# Patient Record
Sex: Male | Born: 1970
Health system: Southern US, Community
[De-identification: ages and names within clinical notes are randomized; demographics above are authoritative.]

## PROBLEM LIST (undated history)

## (undated) DIAGNOSIS — F419 Anxiety disorder, unspecified: Secondary | ICD-10-CM

## (undated) DIAGNOSIS — T7840XA Allergy, unspecified, initial encounter: Secondary | ICD-10-CM

## (undated) HISTORY — DX: Anxiety disorder, unspecified: F41.9

## (undated) HISTORY — DX: Allergy, unspecified, initial encounter: T78.40XA

---

## 1999-04-27 ENCOUNTER — Emergency Department (HOSPITAL_COMMUNITY): Admission: EM | Admit: 1999-04-27 | Discharge: 1999-04-27 | Payer: Self-pay | Admitting: Emergency Medicine

## 2000-08-07 ENCOUNTER — Emergency Department (HOSPITAL_COMMUNITY): Admission: EM | Admit: 2000-08-07 | Discharge: 2000-08-07 | Payer: Self-pay | Admitting: Emergency Medicine

## 2000-08-17 ENCOUNTER — Emergency Department (HOSPITAL_COMMUNITY): Admission: EM | Admit: 2000-08-17 | Discharge: 2000-08-17 | Payer: Self-pay | Admitting: Emergency Medicine

## 2011-05-31 ENCOUNTER — Ambulatory Visit (INDEPENDENT_AMBULATORY_CARE_PROVIDER_SITE_OTHER): Payer: BC Managed Care – PPO

## 2011-05-31 DIAGNOSIS — J069 Acute upper respiratory infection, unspecified: Secondary | ICD-10-CM

## 2011-10-07 ENCOUNTER — Ambulatory Visit: Payer: BC Managed Care – PPO | Admitting: Family Medicine

## 2011-10-07 DIAGNOSIS — J309 Allergic rhinitis, unspecified: Secondary | ICD-10-CM

## 2011-10-07 MED ORDER — CIPROFLOXACIN HCL 0.3 % OP SOLN: OPHTHALMIC | Status: DC

## 2011-10-07 MED ORDER — FLUTICASONE PROPIONATE 50 MCG/ACT NA SUSP: 2.0000 | Freq: Every day | NASAL | Status: DC

## 2011-10-07 NOTE — Progress Notes (Signed)
  Subjective:    Patient ID: Kevin Wang, male    DOB: 1970/08/20, 41 y.o.   MRN: [id number]  HPI 41 yo male with URI symptoms. Woke up yesterday with red eyes.  THrough the day developed sinus pain/pressure, runny nose, sneezing.  No cough.  Eyes hurt in the light, especially the left.  No fever.   Did mow very tall grass the day before symptoms started.   Review of Systems Negative except as per HPI     Objective:   Physical Exam  Constitutional: He appears well-developed. No distress.  HENT:  Right Ear: Tympanic membrane, external ear and ear canal normal. Tympanic membrane is not injected, not scarred, not perforated, not erythematous, not retracted and not bulging.  Left Ear: Tympanic membrane, external ear and ear canal normal. Tympanic membrane is not injected, not scarred, not perforated, not erythematous, not retracted and not bulging.  Nose: Mucosal edema present. No rhinorrhea. Right sinus exhibits no maxillary sinus tenderness and no frontal sinus tenderness. Left sinus exhibits no maxillary sinus tenderness and no frontal sinus tenderness.  Mouth/Throat: Uvula is midline, oropharynx is clear and moist and mucous membranes are normal. No oropharyngeal exudate or tonsillar abscesses.  Eyes: Right conjunctiva is injected. Left conjunctiva is injected.  Cardiovascular: Normal rate, regular rhythm, normal heart sounds and intact distal pulses.   No murmur heard. Pulmonary/Chest: Effort normal and breath sounds normal. No respiratory distress. He has no wheezes. He has no rales.  Lymphadenopathy:       Head (right side): No submandibular and no preauricular adenopathy present.       Head (left side): No submandibular and no preauricular adenopathy present.       Right cervical: No superficial cervical and no posterior cervical adenopathy present.      Left cervical: No superficial cervical and no posterior cervical adenopathy present.       Right: No supraclavicular adenopathy  present.       Left: No supraclavicular adenopathy present.  Skin: Skin is warm and dry.          Assessment & Plan:  Allergic rhinitis with conjunctivitis - ciloxan (contact lens wearer) and flonase

## 2012-09-03 ENCOUNTER — Ambulatory Visit: Payer: BC Managed Care – PPO | Admitting: Physician Assistant

## 2012-09-03 VITALS — HR 95 | Temp 98.8°F | Resp 16 | Ht 68.5 in | Wt 226.0 lb

## 2012-09-03 DIAGNOSIS — J309 Allergic rhinitis, unspecified: Secondary | ICD-10-CM

## 2012-09-03 DIAGNOSIS — J069 Acute upper respiratory infection, unspecified: Secondary | ICD-10-CM

## 2012-09-03 MED ORDER — BENZONATATE 100 MG PO CAPS: 100.0000 mg | ORAL_CAPSULE | Freq: Three times a day (TID) | ORAL | Status: DC | PRN

## 2012-09-03 MED ORDER — IPRATROPIUM BROMIDE 0.03 % NA SOLN: 2.0000 | Freq: Two times a day (BID) | NASAL | Status: DC

## 2012-09-03 MED ORDER — GUAIFENESIN ER 1200 MG PO TB12: 1.0000 | ORAL_TABLET | Freq: Two times a day (BID) | ORAL | Status: DC | PRN

## 2012-09-03 NOTE — Patient Instructions (Addendum)
Begin using Atrovent nasal spray twice daily - this will help dry up the runny nose and post-nasal drainage, as well as relieve pressure in the ears.  Continue Mucinex twice daily.  Begin an allergy medicine daily (Claritin/Allegra/Zyrtec - whichever is cheapest) - this will help control symptoms and will help with any allergic component.  Tessalon Perles three times per day if needed for cough.  Plenty of fluids (Water is best!)  If any of your symptoms are worsening or not improving please let us know.   Upper Respiratory Infection, Adult An upper respiratory infection (URI) is also sometimes known as the common cold. The upper respiratory tract includes the nose, sinuses, throat, trachea, and bronchi. Bronchi are the airways leading to the lungs. Most people improve within 1 week, but symptoms can last up to 2 weeks. A residual cough may last even longer.  CAUSES Many different viruses can infect the tissues lining the upper respiratory tract. The tissues become irritated and inflamed and often become very moist. Mucus production is also common. A cold is contagious. You can easily spread the virus to others by oral contact. This includes kissing, sharing a glass, coughing, or sneezing. Touching your mouth or nose and then touching a surface, which is then touched by another person, can also spread the virus. SYMPTOMS  Symptoms typically develop 1 to 3 days after you come in contact with a cold virus. Symptoms vary from person to person. They may include:  Runny nose.  Sneezing.  Nasal congestion.  Sinus irritation.  Sore throat.  Loss of voice (laryngitis).  Cough.  Fatigue.  Muscle aches.  Loss of appetite.  Headache.  Low-grade fever. DIAGNOSIS  You might diagnose your own cold based on familiar symptoms, since most people get a cold 2 to 3 times a year. Your caregiver can confirm this based on your exam. Most importantly, your caregiver can check that your symptoms are not  due to another disease such as strep throat, sinusitis, pneumonia, asthma, or epiglottitis. Blood tests, throat tests, and X-rays are not necessary to diagnose a common cold, but they may sometimes be helpful in excluding other more serious diseases. Your caregiver will decide if any further tests are required. RISKS AND COMPLICATIONS  You may be at risk for a more severe case of the common cold if you smoke cigarettes, have chronic heart disease (such as heart failure) or lung disease (such as asthma), or if you have a weakened immune system. The very young and very old are also at risk for more serious infections. Bacterial sinusitis, middle ear infections, and bacterial pneumonia can complicate the common cold. The common cold can worsen asthma and chronic obstructive pulmonary disease (COPD). Sometimes, these complications can require emergency medical care and may be life-threatening. PREVENTION  The best way to protect against getting a cold is to practice good hygiene. Avoid oral or hand contact with people with cold symptoms. Wash your hands often if contact occurs. There is no clear evidence that vitamin C, vitamin E, echinacea, or exercise reduces the chance of developing a cold. However, it is always recommended to get plenty of rest and practice good nutrition. TREATMENT  Treatment is directed at relieving symptoms. There is no cure. Antibiotics are not effective, because the infection is caused by a virus, not by bacteria. Treatment may include:  Increased fluid intake. Sports drinks offer valuable electrolytes, sugars, and fluids.  Breathing heated mist or steam (vaporizer or shower).  Eating chicken soup or other  clear broths, and maintaining good nutrition.  Getting plenty of rest.  Using gargles or lozenges for comfort.  Controlling fevers with ibuprofen or acetaminophen as directed by your caregiver.  Increasing usage of your inhaler if you have asthma. Zinc gel and zinc  lozenges, taken in the first 24 hours of the common cold, can shorten the duration and lessen the severity of symptoms. Pain medicines may help with fever, muscle aches, and throat pain. A variety of non-prescription medicines are available to treat congestion and runny nose. Your caregiver can make recommendations and may suggest nasal or lung inhalers for other symptoms.  HOME CARE INSTRUCTIONS   Only take over-the-counter or prescription medicines for pain, discomfort, or fever as directed by your caregiver.  Use a warm mist humidifier or inhale steam from a shower to increase air moisture. This may keep secretions moist and make it easier to breathe.  Drink enough water and fluids to keep your urine clear or pale yellow.  Rest as needed.  Return to work when your temperature has returned to normal or as your caregiver advises. You may need to stay home longer to avoid infecting others. You can also use a face mask and careful hand washing to prevent spread of the virus. SEEK MEDICAL CARE IF:   After the first few days, you feel you are getting worse rather than better.  You need your caregiver's advice about medicines to control symptoms.  You develop chills, worsening shortness of breath, or brown or red sputum. These may be signs of pneumonia.  You develop yellow or brown nasal discharge or pain in the face, especially when you bend forward. These may be signs of sinusitis.  You develop a fever, swollen neck glands, pain with swallowing, or white areas in the back of your throat. These may be signs of strep throat. SEEK IMMEDIATE MEDICAL CARE IF:   You have a fever.  You develop severe or persistent headache, ear pain, sinus pain, or chest pain.  You develop wheezing, a prolonged cough, cough up blood, or have a change in your usual mucus (if you have chronic lung disease).  You develop sore muscles or a stiff neck. Document Released: 11/05/2000 Document Revised: 08/04/2011  Document Reviewed: 09/13/2010 [facility] Patient Information 2013 [place], [place].   Allergic Rhinitis Allergic rhinitis is when the mucous membranes in the nose respond to allergens. Allergens are particles in the air that cause your body to have an allergic reaction. This causes you to release allergic antibodies. Through a chain of events, these eventually cause you to release histamine into the blood stream (hence the use of antihistamines). Although meant to be protective to the body, it is this release that causes your discomfort, such as frequent sneezing, congestion and an itchy runny nose.  CAUSES  The pollen allergens may come from grasses, trees, and weeds. This is seasonal allergic rhinitis, or "hay fever." Other allergens cause year-round allergic rhinitis (perennial allergic rhinitis) such as house dust mite allergen, pet dander and mold spores.  SYMPTOMS   Nasal stuffiness (congestion).  Runny, itchy nose with sneezing and tearing of the eyes.  There is often an itching of the mouth, eyes and ears. It cannot be cured, but it can be controlled with medications. DIAGNOSIS  If you are unable to determine the offending allergen, skin or blood testing may find it. TREATMENT   Avoid the allergen.  Medications and allergy shots (immunotherapy) can help.  Hay fever may often be treated with antihistamines  in pill or nasal spray forms. Antihistamines block the effects of histamine. There are over-the-counter medicines that may help with nasal congestion and swelling around the eyes. Check with your caregiver before taking or giving this medicine. If the treatment above does not work, there are many new medications your caregiver can prescribe. Stronger medications may be used if initial measures are ineffective. Desensitizing injections can be used if medications and avoidance fails. Desensitization is when a patient is given ongoing shots until the body becomes less sensitive to the  allergen. Make sure you follow up with your caregiver if problems continue. SEEK MEDICAL CARE IF:   You develop fever (more than 100.5 F (38.1 C).  You develop a cough that does not stop easily (persistent).  You have shortness of breath.  You start wheezing.  Symptoms interfere with normal daily activities. Document Released: 02/04/2001 Document Revised: 08/04/2011 Document Reviewed: 08/16/2008 [facility] Patient Information 2013 [place], [place].

## 2012-09-03 NOTE — Progress Notes (Signed)
  Subjective:    Patient ID: Kevin Wang, male    DOB: Aug 07, 1970, 42 y.o.   MRN: [id number]  HPI   Kevin Wang is a very pleasant 42 yr old male here with concern for illness.  States that he has been feeling "above average tired" for the last few days.  Two days ago he began sneezing and feeling congested.  Post-nasal drainage, cough, sore throat, itchy ears.  Muscles sore.  Occasionally kind of dizzy.  Denies fevers or chills.  Wife has been sick with similar symptoms, treated for a sinus infection.  Daughter also with similar symptoms.  Pt has been using Mucinex.  Pt seen about a year ago here for allergic rhinitis, not currently treating allergies.  States "this doesn't feel like allergies."    Review of Systems  Constitutional: Positive for fatigue. Negative for fever and chills.  HENT: Positive for ear pain (itchy, pressure), congestion, sore throat, rhinorrhea, sneezing and postnasal drip.   Respiratory: Positive for cough. Negative for shortness of breath and wheezing.   Cardiovascular: Negative.   Gastrointestinal: Negative.   Musculoskeletal: Positive for myalgias.  Skin: Negative.   Neurological: Positive for dizziness. Negative for light-headedness and headaches.       Objective:   Physical Exam  Vitals reviewed. Constitutional: He is oriented to person, place, and time. He appears well-developed and well-nourished. No distress.  HENT:  Head: Normocephalic and atraumatic.  Right Ear: Tympanic membrane and ear canal normal.  Left Ear: Tympanic membrane and ear canal normal.  Nose: Mucosal edema (pale turbinates) and rhinorrhea present. Right sinus exhibits no maxillary sinus tenderness and no frontal sinus tenderness. Left sinus exhibits no maxillary sinus tenderness and no frontal sinus tenderness.  Mouth/Throat: Uvula is midline, oropharynx is clear and moist and mucous membranes are normal.  Neck: Neck supple.  Cardiovascular: Normal rate, regular rhythm and normal heart  sounds.   Pulmonary/Chest: Effort normal and breath sounds normal. He has no wheezes. He has no rales.  Lymphadenopathy:    He has no cervical adenopathy.  Neurological: He is alert and oriented to person, place, and time.  Skin: Skin is warm and dry.  Psychiatric: He has a normal mood and affect. His behavior is normal.     Filed Vitals:   09/03/12 1426  Pulse: 95  Temp: 98.8 F (37.1 C)  Resp: 16        Assessment & Plan:  URI (upper respiratory infection) - Plan: ipratropium (ATROVENT) 0.03 % nasal spray, Guaifenesin (MUCINEX MAXIMUM STRENGTH) 1200 MG TB12, benzonatate (TESSALON) 100 MG capsule  -- Very pleasant 42 yr old male with two days of URI symptoms.  Afebrile, lungs CTA.  Discussed with pt that this is likely viral, and he does not warrant abx at this time.  Will treat symptoms with Atrovent nasal, Tessalon, Mucinex.  Push fluids.    Allergic rhinitis  -- Also suspect an allergic component to his symptoms.  Encouraged pt to begin OTC antihistamine daily.  This will help with symptoms regardless, but may offer extra benefit if there is underlying allergic rhinitis.    Pt will call if any symptoms worsen or do not improve.  Discussed with pt that in that case he may need to RTC.  He understands and is in agreement.

## 2012-11-27 ENCOUNTER — Ambulatory Visit (INDEPENDENT_AMBULATORY_CARE_PROVIDER_SITE_OTHER): Payer: BC Managed Care – PPO | Admitting: Family Medicine

## 2012-11-27 VITALS — BP 122/80 | HR 67 | Temp 97.9°F | Resp 18 | Ht 69.5 in | Wt 231.0 lb

## 2012-11-27 DIAGNOSIS — R195 Other fecal abnormalities: Secondary | ICD-10-CM

## 2012-11-27 DIAGNOSIS — M549 Dorsalgia, unspecified: Secondary | ICD-10-CM

## 2012-11-27 DIAGNOSIS — R197 Diarrhea, unspecified: Secondary | ICD-10-CM

## 2012-11-27 DIAGNOSIS — E039 Hypothyroidism, unspecified: Secondary | ICD-10-CM

## 2012-11-27 LAB — COMPREHENSIVE METABOLIC PANEL WITH GFR
AST: 19 U/L (ref 0–37)
Albumin: 4.4 g/dL (ref 3.5–5.2)
BUN: 16 mg/dL (ref 6–23)
Calcium: 9.5 mg/dL (ref 8.4–10.5)
Chloride: 103 meq/L (ref 96–112)
Glucose, Bld: 83 mg/dL (ref 70–99)
Potassium: 4.2 meq/L (ref 3.5–5.3)
Sodium: 139 meq/L (ref 135–145)
Total Protein: 6.9 g/dL (ref 6.0–8.3)

## 2012-11-27 LAB — POCT CBC
Granulocyte percent: 53.6 % (ref 37–80)
HCT, POC: 43.8 % (ref 43.5–53.7)
Hemoglobin: 14.2 g/dL (ref 14.1–18.1)
Lymph, poc: 2.6 (ref 0.6–3.4)
MCH, POC: 31 pg (ref 27–31.2)
MCHC: 32.4 g/dL (ref 31.8–35.4)
MCV: 95.6 fL (ref 80–97)
MID (cbc): 0.6 (ref 0–0.9)
MPV: 8.1 fL (ref 0–99.8)
POC Granulocyte: 3.6 (ref 2–6.9)
POC LYMPH PERCENT: 38.2 %L (ref 10–50)
POC MID %: 8.2 %M (ref 0–12)
Platelet Count, POC: 266 10*3/uL (ref 142–424)
RBC: 4.58 M/uL — AB (ref 4.69–6.13)
RDW, POC: 12.9 %
WBC: 6.8 10*3/uL (ref 4.6–10.2)

## 2012-11-27 LAB — COMPREHENSIVE METABOLIC PANEL
ALT: 15 U/L (ref 0–53)
Alkaline Phosphatase: 66 U/L (ref 39–117)
CO2: 27 mEq/L (ref 19–32)
Creat: 1.26 mg/dL (ref 0.50–1.35)
Total Bilirubin: 0.4 mg/dL (ref 0.3–1.2)

## 2012-11-27 LAB — LIPASE: Lipase: 25 U/L (ref 0–75)

## 2012-11-27 LAB — TSH: TSH: 2.22 u[IU]/mL (ref 0.350–4.500)

## 2012-11-27 NOTE — Progress Notes (Signed)
[facility]:  Office Visit  Chief Complaint:  Chief Complaint  Patient presents with  . Stool Color Change    White stool    HPI: Kevin Wang is a 42 y.o. male who complains of  2-3 week history of noticing stool had changes to it, sometimes he would see white stool intermixed with normal coler stool. No fevers, chills, unintentional weightloss. No blood in stool or urine.  Eating more Chobani green yogurt. Also drinking more smoothies with yogurt in them. Only takes multivitamins.He has taken [place] for years now,He takes protein shakes everyday.Was on abx for tooth extraction 1 week ago, then he started having nonbloody diarrhea. No he noticed this. The diarrhea started after he was on abx. No new travels, no well water, no family or personal hsitory of GI disorder,  Gallbladder dz, pancreatic dz, liver dz . Denies Crohn, colitis, IBD, IBS  Denies depression, is a little sad that Daughter is going to [employer], but he is very close to her. Has had normal memory issues, thinks his mempory could be better but not unusual. He is sleeping well. Was on meds for hypothyroid but stopped. He states he has been feeling tired more than normal.    Past Medical History  Diagnosis Date  . Allergy    History reviewed. No pertinent past surgical history. History   Social History  . Marital Status: Married    Spouse Name: N/A    Number of Children: N/A  . Years of Education: N/A   Occupational History  . auditor [employer]   Social History Main Topics  . Smoking status: Never Smoker   . Smokeless tobacco: None  . Alcohol Use: None  . Drug Use: None  . Sexually Active: Yes     Comment: number of sex partners in the last 12 months 1   Other Topics Concern  . None   Social History Narrative   Exercise cardio and   Family History  Problem Relation Age of Onset  . Asthma Mother   . Cancer Mother     colon   No Known Allergies Prior to  Admission medications   Medication Sig Start Date End Date Taking? Authorizing Provider  benzonatate (TESSALON) 100 MG capsule Take 1-2 capsules (100-200 mg total) by mouth 3 (three) times daily as needed for cough. 09/03/12   Eleanore E Egan, PA-C  Guaifenesin (MUCINEX MAXIMUM STRENGTH) 1200 MG TB12 Take 1 tablet (1,200 mg total) by mouth every 12 (twelve) hours as needed. 09/03/12   Eleanore Delia Chimes, PA-C  ipratropium (ATROVENT) 0.03 % nasal spray Place 2 sprays into the nose 2 (two) times daily. 09/03/12   Eleanore Delia Chimes, PA-C     ROS: The patient denies fevers, chills, night sweats, unintentional weight loss, chest pain, palpitations, wheezing, dyspnea on exertion, nausea, vomiting, abdominal pain, dysuria, hematuria, melena, numbness, weakness, or tingling.   All other systems have been reviewed and were otherwise negative with the exception of those mentioned in the HPI and as above.    PHYSICAL EXAM: Filed Vitals:   11/27/12 1230  BP: 122/80  Pulse: 67  Temp: 97.9 F (36.6 C)  Resp: 18   Filed Vitals:   11/27/12 1230  Height: 5' 9.5" (1.765 m)  Weight: 231 lb (104.781 kg)   Body mass index is 33.64 kg/(m^2).  General: Alert, no acute distress HEENT:  Normocephalic, atraumatic, oropharynx patent. EOMI, PERRLA, no thyroidmegaly Cardiovascular:  Regular rate and rhythm, no rubs  murmurs or gallops.  No Carotid bruits, radial pulse intact. No pedal edema.  Respiratory: Clear to auscultation bilaterally.  No wheezes, rales, or rhonchi.  No cyanosis, no use of accessory musculature GI: No organomegaly, abdomen is soft and non-tender, positive bowel sounds.  No masses. Skin: No rashes. Neurologic: Facial musculature symmetric. Psychiatric: Patient is appropriate throughout our interaction. Lymphatic: No cervical lymphadenopathy Musculoskeletal: Gait intact.   LABS: Results for orders placed in visit on 11/27/12  POCT CBC      Result Value Range   WBC 6.8  4.6 - 10.2 K/uL    Lymph, poc 2.6  0.6 - 3.4   POC LYMPH PERCENT 38.2  10 - 50 %L   MID (cbc) 0.6  0 - 0.9   POC MID % 8.2  0 - 12 %M   POC Granulocyte 3.6  2 - 6.9   Granulocyte percent 53.6  37 - 80 %G   RBC 4.58 (*) 4.69 - 6.13 M/uL   Hemoglobin 14.2  14.1 - 18.1 g/dL   HCT, POC 96.0  45.4 - 53.7 %   MCV 95.6  80 - 97 fL   MCH, POC 31.0  27 - 31.2 pg   MCHC 32.4  31.8 - 35.4 g/dL   RDW, POC 09.8     Platelet Count, POC 266  142 - 424 K/uL   MPV 8.1  0 - 99.8 fL     EKG/XRAY:   Primary read interpreted by Dr. Conley Rolls at [facility].   ASSESSMENT/PLAN: Encounter Diagnoses  Name Primary?  . Abnormal stool color Yes  . Back pain   . Diarrhea   . Unspecified hypothyroidism    Will await labs ? Etiology of intermitent pale colored stools: Liver/Pancreatic/GB  issues vs yeast in stool He is not feeling himself in terms of motivation, not motivated so will get TSH level Apparently was supposed to be on thyroid medicine CMP, lipase TSH, stool cx and OP pending He will bring back stool sample I will f/u after labs and stool samples given and results are in If no improvement or no dx then will refer to GI    Nariah Morgano PHUONG, DO 11/27/2012 2:32 PM

## 2012-11-30 LAB — OVA AND PARASITE EXAMINATION: OP: NONE SEEN

## 2012-12-02 LAB — STOOL CULTURE

## 2013-11-17 ENCOUNTER — Telehealth: Payer: Self-pay

## 2013-11-17 NOTE — Telephone Encounter (Signed)
PATIENT WOULD LIKE TO KNOW IF AN OFFICE VISIT IS NECESSARY TO GET A PERSCRIPTION FOR ANXIETY MEDICATION (PATIENT CAN'T REMEMBER THE NAME OF THE MEDICATION). PLEASE CALL PATIENT! [phone]

## 2013-11-18 NOTE — Telephone Encounter (Signed)
Advised pt he does need an OV for this prescription. Has not been seen since 6/14

## 2013-11-30 ENCOUNTER — Ambulatory Visit (INDEPENDENT_AMBULATORY_CARE_PROVIDER_SITE_OTHER): Payer: BC Managed Care – PPO | Admitting: Family Medicine

## 2013-11-30 VITALS — BP 128/88 | HR 93 | Temp 98.4°F | Resp 18 | Ht 68.5 in | Wt 227.0 lb

## 2013-11-30 DIAGNOSIS — F411 Generalized anxiety disorder: Secondary | ICD-10-CM

## 2013-11-30 MED ORDER — ALPRAZOLAM 0.25 MG PO TABS: 0.2500 mg | ORAL_TABLET | Freq: Two times a day (BID) | ORAL | Status: DC | PRN

## 2013-11-30 NOTE — Progress Notes (Signed)
   Subjective:    Patient ID: Kevin Wang, male    DOB: 1970-12-04, 43 y.o.   MRN: [id number]  HPI This is a very pleasant 43 yo male who presents today with complaint of performance anxiety related to his work. Last week he was required to give a presentation and found that he was very panicked. He became diaphoretic and flustered. He has had problems with presentations in the past and was prescribed something from here several years ago- thinks it was xanax at a low dose. He reports using this sparingly over the last several years until he ran out of the medicine. He does not receive medical care anywhere else and has been healthy, requiring no regular medications.   He denies any anxiety or stress in other aspects of his life. He exercises 5-6 times a week and sleeps well.    Review of Systems No chest pain, no SOB  Objective:   Physical Exam  Vitals reviewed. Constitutional: He is oriented to person, place, and time. He appears well-developed and well-nourished. No distress.  HENT:  Head: Normocephalic and atraumatic.  Eyes: Conjunctivae are normal. Right eye exhibits no discharge. Left eye exhibits no discharge.  Neck: Normal range of motion. Neck supple.  Cardiovascular: Normal rate, regular rhythm and normal heart sounds.   Pulmonary/Chest: Effort normal and breath sounds normal.  Neurological: He is alert and oriented to person, place, and time.  Skin: Skin is warm and dry. He is not diaphoretic.  Psychiatric: He has a normal mood and affect. His behavior is normal. Judgment and thought content normal.      Assessment & Plan:  1. Anxiety state, unspecified - ALPRAZolam (XANAX) 0.25 MG tablet; Take 1 tablet (0.25 mg total) by mouth 2 (two) times daily as needed for anxiety.  Dispense: 20 tablet; Refill: 0 -discussed habit forming nature of xanax and that it is suitable for occasional, episodic use. If he finds that he is having more than a couple episodes of anxiety a month, he  will consider other treatment -encouraged continued regular exercise for stress relief. -recommended that he make an appointment for CPE at the appointment center.  Emi Belfasteborah B. Gessner, FNP-BC  [facility], [facility]  11/30/2013 9:19 AM

## 2014-05-27 ENCOUNTER — Other Ambulatory Visit: Payer: Self-pay | Admitting: Family Medicine

## 2014-05-29 ENCOUNTER — Telehealth: Payer: Self-pay | Admitting: Family Medicine

## 2014-05-29 NOTE — Telephone Encounter (Signed)
Called patient and left message. Will fax Xanax Rx. Encouraged him to make appointment for CPE.

## 2014-10-12 ENCOUNTER — Ambulatory Visit (INDEPENDENT_AMBULATORY_CARE_PROVIDER_SITE_OTHER): Payer: BLUE CROSS/BLUE SHIELD

## 2014-10-12 ENCOUNTER — Ambulatory Visit (INDEPENDENT_AMBULATORY_CARE_PROVIDER_SITE_OTHER): Payer: BLUE CROSS/BLUE SHIELD | Admitting: Family Medicine

## 2014-10-12 VITALS — BP 124/72 | HR 67 | Temp 98.0°F | Resp 18 | Ht 70.0 in | Wt 240.0 lb

## 2014-10-12 DIAGNOSIS — M25571 Pain in right ankle and joints of right foot: Secondary | ICD-10-CM | POA: Diagnosis not present

## 2014-10-12 DIAGNOSIS — F4329 Adjustment disorder with other symptoms: Secondary | ICD-10-CM | POA: Diagnosis not present

## 2014-10-12 MED ORDER — ALPRAZOLAM 0.25 MG PO TABS: 0.2500 mg | ORAL_TABLET | Freq: Two times a day (BID) | ORAL | Status: DC | PRN

## 2014-10-12 NOTE — Patient Instructions (Signed)
Use the alprazolam only when needed  Wear the ankle brace for a couple weeks to try and support the ankle. Return if problems persist.  Ibuprofen 600 mg 3 times daily if needed for pain  Ice the ankle several times daily.

## 2014-10-12 NOTE — Progress Notes (Addendum)
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 He was taking out the trash yesterday and there is a broken board on the steps which caused him to turn his right ankle. He was able to grab the rail did not fall completely. The D and have pain initially, but it was not real bad. He did go ahead and go to the gym yesterday and a but then it started hurting enough that he iced it several times. He is continued to have pain, on weightbearing especially in the right ankle on the dorsal aspect.  Into refill on the alprazolam. He'll use it when he knows he is in an anxiety provoking situations such as giving a presentation.  Also describes having some episodic sharp pains here and there in his body, sometimes a couple places simultaneously, which are very fleeting. No particular pattern to this. Discussed that a new nothing differently do about them unless he gets more persistent or pattern to it.  Objective: Motion of toes and ankle are normal. He has a little bit of swelling on the top of the foot. No obvious ecchymosis. Tenderness distal and medial to the right medial malleolus and distal to the lateral malleolus. That seems to be where it hurts most. Both inversion and eversion of the foot hurt considerably though lateral flexion or dorsiflexion do not seem to hurt that much.  Assessment: Probable sprained ankle with ankle pain Presentation anxiety  Plan: X-ray ankle and foot  [facility] reading (PRIMARY) by  Dr. Alwyn RenHopper No acute injury. Old spurs of the calcaneus  Treat with ankle splint  Reassurance. See instructions

## 2015-05-10 ENCOUNTER — Other Ambulatory Visit: Payer: Self-pay

## 2015-05-10 DIAGNOSIS — F4329 Adjustment disorder with other symptoms: Secondary | ICD-10-CM

## 2015-05-10 NOTE — Telephone Encounter (Signed)
Pharm requests RF of alprazolam. Pended. 

## 2015-05-18 ENCOUNTER — Other Ambulatory Visit: Payer: Self-pay

## 2015-05-18 DIAGNOSIS — F4329 Adjustment disorder with other symptoms: Secondary | ICD-10-CM

## 2015-05-18 NOTE — Telephone Encounter (Signed)
Pharm reqs RF of alprazolam. Pended. 

## 2015-05-21 ENCOUNTER — Other Ambulatory Visit: Payer: Self-pay

## 2015-05-21 DIAGNOSIS — F4329 Adjustment disorder with other symptoms: Secondary | ICD-10-CM

## 2015-05-21 NOTE — Telephone Encounter (Signed)
Pharm reqs RFs of alprazolam. Pended.

## 2015-11-19 ENCOUNTER — Other Ambulatory Visit: Payer: Self-pay

## 2015-11-20 ENCOUNTER — Telehealth: Payer: Self-pay

## 2015-11-22 NOTE — Telephone Encounter (Signed)
Over a year since seen.  Needs to come to see a provider if need for rx is to be considered.

## 2015-11-25 NOTE — Telephone Encounter (Signed)
LMOM at pharmacy to deny Rf, pt needs OV.

## 2015-12-06 ENCOUNTER — Ambulatory Visit (INDEPENDENT_AMBULATORY_CARE_PROVIDER_SITE_OTHER): Payer: BLUE CROSS/BLUE SHIELD | Admitting: Family Medicine

## 2015-12-06 ENCOUNTER — Encounter: Payer: Self-pay | Admitting: Family Medicine

## 2015-12-06 VITALS — BP 120/72 | HR 84 | Temp 98.6°F | Resp 16 | Ht 69.0 in | Wt 195.8 lb

## 2015-12-06 DIAGNOSIS — F418 Other specified anxiety disorders: Secondary | ICD-10-CM | POA: Diagnosis not present

## 2015-12-06 MED ORDER — ALPRAZOLAM 0.25 MG PO TABS: 0.2500 mg | ORAL_TABLET | Freq: Two times a day (BID) | ORAL | Status: DC | PRN

## 2015-12-06 NOTE — Patient Instructions (Addendum)
     IF you received an x-ray today, you will receive an invoice from [facility] Please contact [facility] at [phone] with questions or concerns regarding your invoice.   IF you received labwork today, you will receive an invoice from [facility]. Please contact [facility] at [phone] with questions or concerns regarding your invoice.   Our billing staff will not be able to assist you with questions regarding bills from these companies.  You will be contacted with the lab results as soon as they are available. The fastest way to get your results is to activate your My Chart account. Instructions are located on the last page of this paperwork. If you have not heard from us regarding the results in 2 weeks, please contact this office.    No change in medications for now. Continue exercise and Xanax as needed for situational anxiety. Follow-up in the next 6 months if possible for physical. Let me know if you have questions in the meantime.

## 2015-12-06 NOTE — Progress Notes (Signed)
By signing my name below, I, Mesha Guinyard, attest that this documentation has been prepared under the direction and in the presence of Meredith StaggersJeffrey Rorie Delmore, MD.  Electronically Signed: Arvilla MarketMesha Guinyard, Medical Scribe. 12/06/2015. 8:20 AM.  Subjective:    Patient ID: Kevin Wang, male    DOB: 10-May-1971, 45 y.o.   MRN: [id number]  HPI Chief Complaint  Patient presents with  . Medication Refill    Alprazolam 0.25 mg    HPI Comments: Kevin Austinshley Cudd is a 45 y.o. male who presents to the [facility] for medication refill previous pt of Dr. Alwyn RenHopper. Hx of anxiety, adjustment disorder, prior notes to use Xanax for appropriate situations such as giving presentation. Last prescription May 19th 2016 for number 20 with 1 refill.  Pt uses Xanax when he knows that he may have an attack- when he's going to have a meeting, or when he's "on the spot". Pt has taken two pills at a time to get rid of his anxiety before. Pt states he takes it once a week on average.  Pt still has some anxiety when he takes it, but he doesn't have an attack. Pt gets sleepy when he takes Xanax at times. Pt exercises 5-6 times a week. Pt denies suicidal ideation, depression, thoughts of self-harm, EtOH abuse, or marijuana use.  Depression screen PHQ 2/9 12/06/2015  Decreased Interest 0  Down, Depressed, Hopeless 0  PHQ - 2 Score 0      There are no active problems to display for this patient.  Past Medical History  Diagnosis Date  . Allergy   . Anxiety    No past surgical history on file. No Known Allergies Prior to Admission medications   Medication Sig Start Date End Date Taking? Authorizing Provider  ALPRAZolam (XANAX) 0.25 MG tablet Take 1 tablet (0.25 mg total) by mouth 2 (two) times daily as needed. for anxiety 10/12/14   Peyton Najjaravid H Hopper, MD   Social History   Social History  . Marital Status: Married    Spouse Name: N/A  . Number of Children: N/A  . Years of Education: N/A   Occupational  History  . auditor [employer]   Social History Main Topics  . Smoking status: Never Smoker   . Smokeless tobacco: Not on file  . Alcohol Use: No  . Drug Use: No  . Sexual Activity: Yes     Comment: number of sex partners in the last 12 months 1   Other Topics Concern  . Not on file   Social History Narrative   Exercise cardio and   Review of Systems  Psychiatric/Behavioral: Negative for suicidal ideas and self-injury. The patient is nervous/anxious.     Objective:  BP 120/72 mmHg  Pulse 84  Temp(Src) 98.6 F (37 C) (Oral)  Resp 16  Ht 5\' 9"  (1.753 m)  Wt 195 lb 12.8 oz (88.814 kg)  BMI 28.90 kg/m2  SpO2 97%  Physical Exam  Constitutional: He is oriented to person, place, and time. He appears well-developed and well-nourished.  HENT:  Head: Normocephalic and atraumatic.  Eyes: EOM are normal. Pupils are equal, round, and reactive to light.  Neck: No JVD present. Carotid bruit is not present.  Cardiovascular: Normal rate, regular rhythm and normal heart sounds.   No murmur heard. Pulmonary/Chest: Effort normal and breath sounds normal. He has no rales.  Musculoskeletal: He exhibits no edema.  Neurological: He is alert and oriented to person, place, and time.  Skin: Skin  is warm and dry.  Psychiatric: He has a normal mood and affect.  Vitals reviewed.  Assessment & Plan:   [name] is a 45 y.o. male Situational anxiety - Plan: ALPRAZolam (XANAX) 0.25 MG tablet Doing well with episodic dosing of Xanax for situation anxiety only. Recommend continue exercise, no change in treatment at this time. Do not think he needs to be on a daily SSRI based on infrequent symptoms. Recheck in the next 6 months for physical.  Meds ordered this encounter  Medications  . ALPRAZolam (XANAX) 0.25 MG tablet    Sig: Take 1 tablet (0.25 mg total) by mouth 2 (two) times daily as needed. for anxiety    Dispense:  20 tablet    Refill:  1   Patient Instructions       IF you  received an x-ray today, you will receive an invoice from [facility]. Please contact [facility] at [phone] with questions or concerns regarding your invoice.   IF you received labwork today, you will receive an invoice from [facility]. Please contact [facility] at [phone] with questions or concerns regarding your invoice.   Our billing staff will not be able to assist you with questions regarding bills from these companies.  You will be contacted with the lab results as soon as they are available. The fastest way to get your results is to activate your My Chart account. Instructions are located on the last page of this paperwork. If you have not heard from [place] regarding the results in 2 weeks, please contact this office.    No change in medications for now. Continue exercise and Xanax as needed for situational anxiety. Follow-up in the next 6 months if possible for physical. Let me know if you have questions in the meantime.    I personally performed the services described in this documentation, which was scribed in my presence. The recorded information has been reviewed and considered, and addended by me as needed.   Signed,   Meredith Staggers, MD [facility].  12/06/2015 8:30 AM

## 2016-06-30 ENCOUNTER — Other Ambulatory Visit: Payer: Self-pay | Admitting: Family Medicine

## 2016-06-30 ENCOUNTER — Other Ambulatory Visit: Payer: Self-pay | Admitting: *Deleted

## 2016-06-30 DIAGNOSIS — F418 Other specified anxiety disorders: Secondary | ICD-10-CM

## 2016-06-30 MED ORDER — ALPRAZOLAM 0.25 MG PO TABS: 0.2500 mg | ORAL_TABLET | Freq: Two times a day (BID) | ORAL | 0 refills | Status: DC | PRN

## 2016-06-30 NOTE — Progress Notes (Unsigned)
Please advise patient that we called in one prescription for xanax, she will need to come in for a follow up for any additional refills

## 2016-07-01 NOTE — Telephone Encounter (Signed)
Spoke with patient he is aware he will need an office visit before his prescription runs out.

## 2017-05-08 ENCOUNTER — Ambulatory Visit (INDEPENDENT_AMBULATORY_CARE_PROVIDER_SITE_OTHER): Payer: BLUE CROSS/BLUE SHIELD | Admitting: Family Medicine

## 2017-05-08 ENCOUNTER — Encounter: Payer: Self-pay | Admitting: Family Medicine

## 2017-05-08 VITALS — BP 150/96 | HR 71 | Temp 98.3°F | Resp 17 | Ht 70.0 in | Wt 219.0 lb

## 2017-05-08 DIAGNOSIS — Z23 Encounter for immunization: Secondary | ICD-10-CM

## 2017-05-08 DIAGNOSIS — Z113 Encounter for screening for infections with a predominantly sexual mode of transmission: Secondary | ICD-10-CM

## 2017-05-08 DIAGNOSIS — Z13220 Encounter for screening for lipoid disorders: Secondary | ICD-10-CM

## 2017-05-08 DIAGNOSIS — Z131 Encounter for screening for diabetes mellitus: Secondary | ICD-10-CM | POA: Diagnosis not present

## 2017-05-08 DIAGNOSIS — Z1211 Encounter for screening for malignant neoplasm of colon: Secondary | ICD-10-CM | POA: Diagnosis not present

## 2017-05-08 DIAGNOSIS — N529 Male erectile dysfunction, unspecified: Secondary | ICD-10-CM | POA: Diagnosis not present

## 2017-05-08 DIAGNOSIS — Z0000 Encounter for general adult medical examination without abnormal findings: Secondary | ICD-10-CM | POA: Diagnosis not present

## 2017-05-08 DIAGNOSIS — R030 Elevated blood-pressure reading, without diagnosis of hypertension: Secondary | ICD-10-CM | POA: Diagnosis not present

## 2017-05-08 DIAGNOSIS — F418 Other specified anxiety disorders: Secondary | ICD-10-CM

## 2017-05-08 MED ORDER — ALPRAZOLAM 0.25 MG PO TABS: 0.2500 mg | ORAL_TABLET | Freq: Two times a day (BID) | ORAL | 0 refills | Status: DC | PRN

## 2017-05-08 MED ORDER — SILDENAFIL CITRATE 100 MG PO TABS: 50.0000 mg | ORAL_TABLET | Freq: Every day | ORAL | 3 refills | Status: DC | PRN

## 2017-05-08 NOTE — Progress Notes (Signed)
Subjective:  By signing my name below, I, Kevin Wang, attest that this documentation has been prepared under the direction and in the presence of Kevin Flood, MD Electronically Signed: Charline Bills, ED Scribe 05/08/2017 at 12:33 PM.   Patient ID: Kevin Wang, male    DOB: 1971-05-09, 46 y.o.   MRN: [id number]  Chief Complaint  Patient presents with  . Annual Exam   HPI Kevin Wang is a 46 y.o. male who presents to Primary Care at [facility] for annual exam.   H/o Situational Anxiety Last seen 11/2015 with episodic use of Xanax only. #20 Xanax with 1 refill provided at that time. Decided against SSRI due to infrequent symptoms. States him and his wife recently separated. He has done counseling but has noticed that anxiety has improved since separating. Pt has only taken Xanax once within the last 3 months. States he mainly uses it for public speaking.   ED Pt reports difficulty obtaining and maintaining erections over the past year, which has worsened. Denies waking with erection as well. Has not taken any meds for this such as Viagra. He is not currently sexually active with a new partner. Denies cp or sob with exercise, h/o MI or heart disease.  CA Screening  Colonoscopy: mother has a hx at age 43 Prostate CA Screening: no family hx. Denies difficulty urinating, hematuria.   STI Screening: pt agrees to screening at this visit  Immunizations Immunization History  Administered Date(s) Administered  . Influenza-Unspecified 02/24/2015  Flu: will have today Tetanus: suspects it was more than 10 yrs  Depression Screening Depression screen [facility] 2/9 05/08/2017 12/06/2015  Decreased Interest 0 0  Down, Depressed, Hopeless 0 0  PHQ - 2 Score 0 0     Visual Acuity Screening   Right eye Left eye Both eyes  Without correction:  20/100   With correction: 20/30  20/30   Vision: pt wears glasses; visit within the yr Dentist: sees regularly  Exercise: 5-6 days/week  Pt does  not check his BP outside of the office. Reports having ~3 alcoholic beverages/week.   There are no active problems to display for this patient.  Past Medical History:  Diagnosis Date  . Allergy   . Anxiety    No past surgical history on file. No Known Allergies Prior to Admission medications   Medication Sig Start Date End Date Taking? Authorizing Provider  ALPRAZolam (XANAX) 0.25 MG tablet Take 1 tablet (0.25 mg total) by mouth 2 (two) times daily as needed. for anxiety 06/30/16   Kevin Flood, MD   Social History   Socioeconomic History  . Marital status: Married    Spouse name: Not on file  . Number of children: Not on file  . Years of education: Not on file  . Highest education level: Not on file  Social Needs  . Financial resource strain: Not on file  . Food insecurity - worry: Not on file  . Food insecurity - inability: Not on file  . Transportation needs - medical: Not on file  . Transportation needs - non-medical: Not on file  Occupational History  . Occupation: Electronics engineer: [employer]  Tobacco Use  . Smoking status: Never Smoker  . Smokeless tobacco: Never Used  Substance and Sexual Activity  . Alcohol use: No  . Drug use: No  . Sexual activity: Yes    Comment: number of sex partners in the last 12 months 1  Other Topics Concern  .  Not on file  Social History Narrative   Exercise cardio and   Review of Systems  Respiratory: Negative for shortness of breath.   Cardiovascular: Negative for chest pain.  Genitourinary: Negative for difficulty urinating and hematuria.  Psychiatric/Behavioral: The patient is nervous/anxious (situational).       Objective:   Physical Exam  Constitutional: He is oriented to person, place, and time. He appears well-developed and well-nourished.  HENT:  Head: Normocephalic and atraumatic.  Right Ear: External ear normal.  Left Ear: External ear normal.  Mouth/Throat: Oropharynx is clear and moist.  Eyes:  Conjunctivae and EOM are normal. Pupils are equal, round, and reactive to light.  Neck: Normal range of motion. Neck supple. No thyromegaly present.  Cardiovascular: Normal rate, regular rhythm, normal heart sounds and intact distal pulses.  Pulmonary/Chest: Effort normal and breath sounds normal. No respiratory distress. He has no wheezes.  Abdominal: Soft. He exhibits no distension. There is no tenderness.  Musculoskeletal: Normal range of motion. He exhibits no edema or tenderness.  Lymphadenopathy:    He has no cervical adenopathy.  Neurological: He is alert and oriented to person, place, and time. He has normal reflexes.  Skin: Skin is warm and dry.  Psychiatric: He has a normal mood and affect. His behavior is normal.  Vitals reviewed.    Vitals:   05/08/17 1152 05/08/17 1257  BP: (!) 142/82 (!) 150/96  Pulse: 71   Resp: 17   Temp: 98.3 F (36.8 C)   TempSrc: Oral   SpO2: 98%   Weight: 219 lb (99.3 kg)   Height: 5\' 10"  (1.778 m)       Assessment & Plan:    [name] is a 46 y.o. male Annual physical exam  - -anticipatory guidance as below in AVS, screening labs above. Health maintenance items as above in HPI discussed/recommended as applicable.   Situational anxiety - Plan: ALPRAZolam (XANAX) 0.25 MG tablet  - overall stable. Continue Xanax for prn use.   Screen for colon cancer - Plan: Ambulatory referral to Gastroenterology  -FH in mother - may be recommended to screen at 46 yo  Erectile dysfunction, unspecified erectile dysfunction type - Plan: sildenafil (VIAGRA) 100 MG tablet, Testosterone, Free, Total, SHBG  - check testosterone, discussed need for am labs to repeat for verification if low.   -viagra Rx given - use lowest effective dose. Side effects discussed (including but not limited to headache/flushing, blue discoloration of vision, possible vascular steal and risk of cardiac effects if underlying unknown coronary artery disease, and permanent  sensorineural hearing loss). Understanding expressed.  Routine screening for STI (sexually transmitted infection) - Plan: HIV antibody, RPR, GC/Chlamydia Probe Amp, CANCELED: C. trachomatis/N. gonorrhoeae RNA  Need for Tdap vaccination - Plan: Tdap vaccine greater than or equal to 7yo IM  Needs flu shot - Plan: Flu Vaccine QUAD 36+ mos IM  Screening for hyperlipidemia - Plan: Lipid panel  Screening for diabetes mellitus - Plan: Comprehensive metabolic panel  Elevated blood pressure reading without diagnosis of hypertension  - prior normal. ?white coat component. Check outside readings, return if remains elevated.   Meds ordered this encounter  Medications  . ALPRAZolam (XANAX) 0.25 MG tablet    Sig: Take 1 tablet (0.25 mg total) by mouth 2 (two) times daily as needed. for anxiety    Dispense:  20 tablet    Refill:  0  . sildenafil (VIAGRA) 100 MG tablet    Sig: Take 0.5-1 tablets (50-100 mg total) by mouth  daily as needed for erectile dysfunction.    Dispense:  5 tablet    Refill:  3   Patient Instructions   I can check a testosterone level, but that will need to be repeated at least twice between 8 and 10 am if that reading is low. Viagra if needed, but see info below and recheck next few months if still having difficulty. Return to the clinic or go to the nearest emergency room if any of your symptoms worsen or new symptoms occur.  Keep a record of your blood pressures outside of the office and return if readings are over 140/90.   Flu vaccine and tetanus shot was given today. I will screen for diabetes, cholesterol, and  sexually transmitted infections with blood work from today as well.  Let me know if there are any questions, and thanks for coming in today.   Erectile Dysfunction Erectile dysfunction (ED) is the inability to get or keep an erection in order to have sexual intercourse. Erectile dysfunction may include:  Inability to get an erection.  Lack of enough  hardness of the erection to allow penetration.  Loss of the erection before sex is finished.  What are the causes? This condition may be caused by:  Certain medicines, such as: ? Pain relievers. ? Antihistamines. ? Antidepressants. ? Blood pressure medicines. ? Water pills (diuretics). ? Ulcer medicines. ? Muscle relaxants. ? Drugs.  Excessive drinking.  Psychological causes, such as: ? Anxiety. ? Depression. ? Sadness. ? Exhaustion. ? Performance fear. ? Stress.  Physical causes, such as: ? Artery problems. This may include diabetes, smoking, liver disease, or atherosclerosis. ? High blood pressure. ? Hormonal problems, such as low testosterone. ? Obesity. ? Nerve problems. This may include back or pelvic injuries, diabetes mellitus, multiple sclerosis, or Parkinson disease.  What are the signs or symptoms? Symptoms of this condition include:  Inability to get an erection.  Lack of enough hardness of the erection to allow penetration.  Loss of the erection before sex is finished.  Normal erections at some times, but with frequent unsatisfactory episodes.  Low sexual satisfaction in either partner due to erection problems.  A curved penis occurring with erection. The curve may cause pain or the penis may be too curved to allow for intercourse.  Never having nighttime erections.  How is this diagnosed? This condition is often diagnosed by:  Performing a physical exam to find other diseases or specific problems with the penis.  Asking you detailed questions about the problem.  Performing blood tests to check for diabetes mellitus or to measure hormone levels.  Performing other tests to check for underlying health conditions.  Performing an ultrasound exam to check for scarring.  Performing a test to check blood flow to the penis.  Doing a sleep study at home to measure nighttime erections.  How is this treated? This condition may be treated  by:  Medicine taken by mouth to help you achieve an erection (oral medicine).  Hormone replacement therapy to replace low testosterone levels.  Medicine that is injected into the penis. Your health care provider may instruct you how to give yourself these injections at home.  Vacuum pump. This is a pump with a ring on it. The pump and ring are placed on the penis and used to create pressure that helps the penis become erect.  Penile implant surgery. In this procedure, you may receive: ? An inflatable implant. This consists of cylinders, a pump, and a reservoir. The  cylinders can be inflated with a fluid that helps to create an erection, and they can be deflated after intercourse. ? A semi-rigid implant. This consists of two silicone rubber rods. The rods provide some rigidity. They are also flexible, so the penis can both curve downward in its normal position and become straight for sexual intercourse.  Blood vessel surgery, to improve blood flow to the penis. During this procedure, a blood vessel from a different part of the body is placed into the penis to allow blood to flow around (bypass) damaged or blocked blood vessels.  Lifestyle changes, such as exercising more, losing weight, and quitting smoking.  Follow these instructions at home: Medicines  Take over-the-counter and prescription medicines only as told by your health care provider. Do not increase the dosage without first discussing it with your health care provider.  If you are using self-injections, perform injections as directed by your health care provider. Make sure to avoid any veins that are on the surface of the penis. After giving an injection, apply pressure to the injection site for 5 minutes. General instructions  Exercise regularly, as directed by your health care provider. Work with your health care provider to lose weight, if needed.  Do not use any products that contain nicotine or tobacco, such as cigarettes  and e-cigarettes. If you need help quitting, ask your health care provider.  Before using a vacuum pump, read the instructions that come with the pump and discuss any questions with your health care provider.  Keep all follow-up visits as told by your health care provider. This is important. Contact a health care provider if:  You feel nauseous.  You vomit. Get help right away if:  You are taking oral or injectable medicines and you have an erection that lasts longer than 4 hours. If your health care provider is unavailable, go to the nearest emergency room for evaluation. An erection that lasts much longer than 4 hours can result in permanent damage to your penis.  You have severe pain in your groin or abdomen.  You develop redness or severe swelling of your penis.  You have redness spreading up into your groin or lower abdomen.  You are unable to urinate.  You experience chest pain or a rapid heart beat (palpitations) after taking oral medicines. Summary  Erectile dysfunction (ED) is the inability to get or keep an erection during sexual intercourse. This problem can usually be treated successfully.  This condition is diagnosed based on a physical exam, your symptoms, and tests to determine the cause. Treatment varies depending on the cause, and may include medicines, hormone therapy, surgery, or vacuum pump.  You may need follow-up visits to make sure that you are using your medicines or devices correctly.  Get help right away if you are taking or injecting medicines and you have an erection that lasts longer than 4 hours. This information is not intended to replace advice given to you by your health care provider. Make sure you discuss any questions you have with your health care provider. Document Released: 05/09/2000 Document Revised: 05/28/2016 Document Reviewed: 05/28/2016 Elsevier Interactive Patient Education  2017 [employer]    Keeping you healthy  Get these  tests  Blood pressure- Have your blood pressure checked once a year by your healthcare provider.  Normal blood pressure is 120/80.  Weight- Have your body mass index (BMI) calculated to screen for obesity.  BMI is a measure of body fat based on height and weight.  You can also calculate your own BMI at https://www.west-esparza.com/.  Cholesterol- Have your cholesterol checked regularly starting at age 36, sooner may be necessary if you have diabetes, high blood pressure, if a family member developed heart diseases at an early age or if you smoke.   Chlamydia, HIV, and other sexual transmitted disease- Get screened each year until the age of 59 then within three months of each new sexual partner.  Diabetes- Have your blood sugar checked regularly if you have high blood pressure, high cholesterol, a family history of diabetes or if you are overweight.  Get these vaccines  Flu shot- Every fall.  Tetanus shot- Every 10 years.  Menactra- Single dose; prevents meningitis.  Take these steps  Don't smoke- If you do smoke, ask your healthcare provider about quitting. For tips on how to quit, go to www.smokefree.gov or call 1-800-QUIT-NOW.  Be physically active- Exercise 5 days a week for at least 30 minutes.  If you are not already physically active start slow and gradually work up to 30 minutes of moderate physical activity.  Examples of moderate activity include walking briskly, mowing the yard, dancing, swimming bicycling, etc.  Eat a healthy diet- Eat a variety of healthy foods such as fruits, vegetables, low fat milk, low fat cheese, yogurt, lean meats, poultry, fish, beans, tofu, etc.  For more information on healthy eating, go to www.thenutritionsource.org  Drink alcohol in moderation- Limit alcohol intake two drinks or less a day.  Never drink and drive.  Dentist- Brush and floss teeth twice daily; visit your dentis twice a year.  Depression-Your emotional health is as important as your  physical health.  If you're feeling down, losing interest in things you normally enjoy please talk with your healthcare provider.  Gun Safety- If you keep a gun in your home, keep it unloaded and with the safety lock on.  Bullets should be stored separately.  Helmet use- Always wear a helmet when riding a motorcycle, bicycle, rollerblading or skateboarding.  Safe sex- If you may be exposed to a sexually transmitted infection, use a condom  Seat belts- Seat bels can save your life; always wear one.  Smoke/Carbon Monoxide detectors- These detectors need to be installed on the appropriate level of your home.  Replace batteries at least once a year.  Skin Cancer- When out in the sun, cover up and use sunscreen SPF 15 or higher.  Violence- If anyone is threatening or hurting you, please tell your healthcare provider.   IF you received an x-ray today, you will receive an invoice from [facility]. Please contact [facility] at [phone] with questions or concerns regarding your invoice.   IF you received labwork today, you will receive an invoice from [facility]. Please contact [facility] at [phone] with questions or concerns regarding your invoice.   Our billing staff will not be able to assist you with questions regarding bills from these companies.  You will be contacted with the lab results as soon as they are available. The fastest way to get your results is to activate your My Chart account. Instructions are located on the last page of this paperwork. If you have not heard from [place] regarding the results in 2 weeks, please contact this office.       I personally performed the services described in this documentation, which was scribed in my presence. The recorded information has been reviewed and considered for accuracy and completeness, addended by me as needed, and agree with information above.  Signed,   Meredith Staggers, MD Primary Care at [facility].  05/09/17 8:37 AM

## 2017-05-08 NOTE — Patient Instructions (Addendum)
I can check a testosterone level, but that will need to be repeated at least twice between 8 and 10 am if that reading is low. Viagra if needed, but see info below and recheck next few months if still having difficulty. Return to the clinic or go to the nearest emergency room if any of your symptoms worsen or new symptoms occur.  Keep a record of your blood pressures outside of the office and return if readings are over 140/90.   Flu vaccine and tetanus shot was given today. I will screen for diabetes, cholesterol, and  sexually transmitted infections with blood work from today as well.  Let me know if there are any questions, and thanks for coming in today.   Erectile Dysfunction Erectile dysfunction (ED) is the inability to get or keep an erection in order to have sexual intercourse. Erectile dysfunction may include:  Inability to get an erection.  Lack of enough hardness of the erection to allow penetration.  Loss of the erection before sex is finished.  What are the causes? This condition may be caused by:  Certain medicines, such as: ? Pain relievers. ? Antihistamines. ? Antidepressants. ? Blood pressure medicines. ? Water pills (diuretics). ? Ulcer medicines. ? Muscle relaxants. ? Drugs.  Excessive drinking.  Psychological causes, such as: ? Anxiety. ? Depression. ? Sadness. ? Exhaustion. ? Performance fear. ? Stress.  Physical causes, such as: ? Artery problems. This may include diabetes, smoking, liver disease, or atherosclerosis. ? High blood pressure. ? Hormonal problems, such as low testosterone. ? Obesity. ? Nerve problems. This may include back or pelvic injuries, diabetes mellitus, multiple sclerosis, or Parkinson disease.  What are the signs or symptoms? Symptoms of this condition include:  Inability to get an erection.  Lack of enough hardness of the erection to allow penetration.  Loss of the erection before sex is finished.  Normal erections  at some times, but with frequent unsatisfactory episodes.  Low sexual satisfaction in either partner due to erection problems.  A curved penis occurring with erection. The curve may cause pain or the penis may be too curved to allow for intercourse.  Never having nighttime erections.  How is this diagnosed? This condition is often diagnosed by:  Performing a physical exam to find other diseases or specific problems with the penis.  Asking you detailed questions about the problem.  Performing blood tests to check for diabetes mellitus or to measure hormone levels.  Performing other tests to check for underlying health conditions.  Performing an ultrasound exam to check for scarring.  Performing a test to check blood flow to the penis.  Doing a sleep study at home to measure nighttime erections.  How is this treated? This condition may be treated by:  Medicine taken by mouth to help you achieve an erection (oral medicine).  Hormone replacement therapy to replace low testosterone levels.  Medicine that is injected into the penis. Your health care provider may instruct you how to give yourself these injections at home.  Vacuum pump. This is a pump with a ring on it. The pump and ring are placed on the penis and used to create pressure that helps the penis become erect.  Penile implant surgery. In this procedure, you may receive: ? An inflatable implant. This consists of cylinders, a pump, and a reservoir. The cylinders can be inflated with a fluid that helps to create an erection, and they can be deflated after intercourse. ? A semi-rigid implant. This consists  of two silicone rubber rods. The rods provide some rigidity. They are also flexible, so the penis can both curve downward in its normal position and become straight for sexual intercourse.  Blood vessel surgery, to improve blood flow to the penis. During this procedure, a blood vessel from a different part of the body is  placed into the penis to allow blood to flow around (bypass) damaged or blocked blood vessels.  Lifestyle changes, such as exercising more, losing weight, and quitting smoking.  Follow these instructions at home: Medicines  Take over-the-counter and prescription medicines only as told by your health care provider. Do not increase the dosage without first discussing it with your health care provider.  If you are using self-injections, perform injections as directed by your health care provider. Make sure to avoid any veins that are on the surface of the penis. After giving an injection, apply pressure to the injection site for 5 minutes. General instructions  Exercise regularly, as directed by your health care provider. Work with your health care provider to lose weight, if needed.  Do not use any products that contain nicotine or tobacco, such as cigarettes and e-cigarettes. If you need help quitting, ask your health care provider.  Before using a vacuum pump, read the instructions that come with the pump and discuss any questions with your health care provider.  Keep all follow-up visits as told by your health care provider. This is important. Contact a health care provider if:  You feel nauseous.  You vomit. Get help right away if:  You are taking oral or injectable medicines and you have an erection that lasts longer than 4 hours. If your health care provider is unavailable, go to the nearest emergency room for evaluation. An erection that lasts much longer than 4 hours can result in permanent damage to your penis.  You have severe pain in your groin or abdomen.  You develop redness or severe swelling of your penis.  You have redness spreading up into your groin or lower abdomen.  You are unable to urinate.  You experience chest pain or a rapid heart beat (palpitations) after taking oral medicines. Summary  Erectile dysfunction (ED) is the inability to get or keep an  erection during sexual intercourse. This problem can usually be treated successfully.  This condition is diagnosed based on a physical exam, your symptoms, and tests to determine the cause. Treatment varies depending on the cause, and may include medicines, hormone therapy, surgery, or vacuum pump.  You may need follow-up visits to make sure that you are using your medicines or devices correctly.  Get help right away if you are taking or injecting medicines and you have an erection that lasts longer than 4 hours. This information is not intended to replace advice given to you by your health care provider. Make sure you discuss any questions you have with your health care provider. Document Released: 05/09/2000 Document Revised: 05/28/2016 Document Reviewed: 05/28/2016 Elsevier Interactive Patient Education  2017 [employer]    Keeping you healthy  Get these tests  Blood pressure- Have your blood pressure checked once a year by your healthcare provider.  Normal blood pressure is 120/80.  Weight- Have your body mass index (BMI) calculated to screen for obesity.  BMI is a measure of body fat based on height and weight. You can also calculate your own BMI at [email or web address]://www.west-[email or web address]/www.[email or web address]/.  Cholesterol- Have your cholesterol checked regularly starting at age 46, sooner may be necessary if you  have diabetes, high blood pressure, if a family member developed heart diseases at an early age or if you smoke.   Chlamydia, HIV, and other sexual transmitted disease- Get screened each year until the age of 46 then within three months of each new sexual partner.  Diabetes- Have your blood sugar checked regularly if you have high blood pressure, high cholesterol, a family history of diabetes or if you are overweight.  Get these vaccines  Flu shot- Every fall.  Tetanus shot- Every 10 years.  Menactra- Single dose; prevents meningitis.  Take these steps  Don't smoke- If you do smoke, ask your  healthcare provider about quitting. For tips on how to quit, go to www.smokefree.gov or call 1-800-QUIT-NOW.  Be physically active- Exercise 5 days a week for at least 30 minutes.  If you are not already physically active start slow and gradually work up to 30 minutes of moderate physical activity.  Examples of moderate activity include walking briskly, mowing the yard, dancing, swimming bicycling, etc.  Eat a healthy diet- Eat a variety of healthy foods such as fruits, vegetables, low fat milk, low fat cheese, yogurt, lean meats, poultry, fish, beans, tofu, etc.  For more information on healthy eating, go to www.thenutritionsource.org  Drink alcohol in moderation- Limit alcohol intake two drinks or less a day.  Never drink and drive.  Dentist- Brush and floss teeth twice daily; visit your dentis twice a year.  Depression-Your emotional health is as important as your physical health.  If you're feeling down, losing interest in things you normally enjoy please talk with your healthcare provider.  Gun Safety- If you keep a gun in your home, keep it unloaded and with the safety lock on.  Bullets should be stored separately.  Helmet use- Always wear a helmet when riding a motorcycle, bicycle, rollerblading or skateboarding.  Safe sex- If you may be exposed to a sexually transmitted infection, use a condom  Seat belts- Seat bels can save your life; always wear one.  Smoke/Carbon Monoxide detectors- These detectors need to be installed on the appropriate level of your home.  Replace batteries at least once a year.  Skin Cancer- When out in the sun, cover up and use sunscreen SPF 15 or higher.  Violence- If anyone is threatening or hurting you, please tell your healthcare provider.   IF you received an x-ray today, you will receive an invoice from [facility]. Please contact [facility] at [phone] with questions or concerns regarding your invoice.   IF you received  labwork today, you will receive an invoice from [facility]. Please contact [facility] at [phone] with questions or concerns regarding your invoice.   Our billing staff will not be able to assist you with questions regarding bills from these companies.  You will be contacted with the lab results as soon as they are available. The fastest way to get your results is to activate your My Chart account. Instructions are located on the last page of this paperwork. If you have not heard from us regarding the results in 2 weeks, please contact this office.

## 2017-05-09 LAB — LIPID PANEL
CHOLESTEROL TOTAL: 153 mg/dL (ref 100–199)
Chol/HDL Ratio: 2.2 ratio (ref 0.0–5.0)
HDL: 70 mg/dL (ref >39)
LDL CALC: 71 mg/dL (ref 0–99)
TRIGLYCERIDES: 62 mg/dL (ref 0–149)
VLDL CHOLESTEROL CAL: 12 mg/dL (ref 5–40)

## 2017-05-09 LAB — COMPREHENSIVE METABOLIC PANEL
ALBUMIN: 4.5 g/dL (ref 3.5–5.5)
ALK PHOS: 62 IU/L (ref 39–117)
ALT: 19 IU/L (ref 0–44)
AST: 22 IU/L (ref 0–40)
Albumin/Globulin Ratio: 1.7 (ref 1.2–2.2)
BILIRUBIN TOTAL: 0.3 mg/dL (ref 0.0–1.2)
BUN / CREAT RATIO: 13 (ref 9–20)
BUN: 14 mg/dL (ref 6–24)
CHLORIDE: 102 mmol/L (ref 96–106)
CO2: 27 mmol/L (ref 20–29)
CREATININE: 1.1 mg/dL (ref 0.76–1.27)
Calcium: 10 mg/dL (ref 8.7–10.2)
GFR calc Af Amer: 93 mL/min/{1.73_m2} (ref >59)
GFR calc non Af Amer: 80 mL/min/{1.73_m2} (ref >59)
GLUCOSE: 105 mg/dL — AB (ref 65–99)
Globulin, Total: 2.7 g/dL (ref 1.5–4.5)
Potassium: 4.6 mmol/L (ref 3.5–5.2)
SODIUM: 140 mmol/L (ref 134–144)
Total Protein: 7.2 g/dL (ref 6.0–8.5)

## 2017-05-09 LAB — TESTOSTERONE, FREE, TOTAL, SHBG
SEX HORMONE BINDING: 60.1 nmol/L — AB (ref 16.5–55.9)
TESTOSTERONE FREE: 12.8 pg/mL (ref 6.8–21.5)
Testosterone: 591 ng/dL (ref 264–916)

## 2017-05-09 LAB — HIV ANTIBODY (ROUTINE TESTING W REFLEX): HIV Screen 4th Generation wRfx: NONREACTIVE

## 2017-05-09 LAB — RPR: RPR: NONREACTIVE

## 2017-05-10 LAB — GC/CHLAMYDIA PROBE AMP
Chlamydia trachomatis, NAA: NEGATIVE
NEISSERIA GONORRHOEAE BY PCR: NEGATIVE

## 2017-05-22 ENCOUNTER — Telehealth: Payer: Self-pay | Admitting: Family Medicine

## 2017-05-22 NOTE — Telephone Encounter (Signed)
Copied from CRM [phone]. Topic: Quick Communication - See Telephone Encounter >> May 22, 2017  4:05 PM Terisa Starraylor, Brittany L wrote: CRM for notification. See Telephone encounter for:   05/22/17.  Please call patient back with lab results. He called from the missed call he had from 12/24, he did not want to wait for a [facility] nurse, the line was busy. [phone]

## 2017-05-22 NOTE — Telephone Encounter (Signed)
Attempted to call pt back to give lab results. Left message for pt to call office back.

## 2017-05-22 NOTE — Telephone Encounter (Signed)
Patient called in to receive results, results given per Dr. Neva SeatGreene noted 05/16/17, patient verbalized understanding and has no questions, unable to document in result note due to result note not being routed.

## 2017-07-06 ENCOUNTER — Encounter: Payer: Self-pay | Admitting: Family Medicine

## 2017-07-06 ENCOUNTER — Ambulatory Visit (INDEPENDENT_AMBULATORY_CARE_PROVIDER_SITE_OTHER): Payer: BLUE CROSS/BLUE SHIELD | Admitting: Family Medicine

## 2017-07-06 ENCOUNTER — Other Ambulatory Visit: Payer: Self-pay

## 2017-07-06 VITALS — BP 128/82 | HR 72 | Temp 98.3°F | Resp 18 | Ht 70.0 in | Wt 222.4 lb

## 2017-07-06 DIAGNOSIS — J069 Acute upper respiratory infection, unspecified: Secondary | ICD-10-CM | POA: Diagnosis not present

## 2017-07-06 DIAGNOSIS — R05 Cough: Secondary | ICD-10-CM

## 2017-07-06 DIAGNOSIS — J22 Unspecified acute lower respiratory infection: Secondary | ICD-10-CM

## 2017-07-06 DIAGNOSIS — R059 Cough, unspecified: Secondary | ICD-10-CM

## 2017-07-06 MED ORDER — HYDROCODONE-HOMATROPINE 5-1.5 MG/5ML PO SYRP: ORAL_SOLUTION | ORAL | 0 refills | Status: DC

## 2017-07-06 MED ORDER — AZITHROMYCIN 250 MG PO TABS: ORAL_TABLET | ORAL | 0 refills | Status: DC

## 2017-07-06 NOTE — Patient Instructions (Addendum)
Cough may still be due to virus. Those usually improve within 7-10 days. Cough syrup at night if needed, mucinex DM during the day. If not improving this week - can start antibiotic.   Return to the clinic or go to the nearest emergency room if any of your symptoms worsen or new symptoms occur.   Cough, Adult Coughing is a reflex that clears your throat and your airways. Coughing helps to heal and protect your lungs. It is normal to cough occasionally, but a cough that happens with other symptoms or lasts a long time may be a sign of a condition that needs treatment. A cough may last only 2-3 weeks (acute), or it may last longer than 8 weeks (chronic). What are the causes? Coughing is commonly caused by:  Breathing in substances that irritate your lungs.  A viral or bacterial respiratory infection.  Allergies.  Asthma.  Postnasal drip.  Smoking.  Acid backing up from the stomach into the esophagus (gastroesophageal reflux).  Certain medicines.  Chronic lung problems, including COPD (or rarely, lung cancer).  Other medical conditions such as heart failure.  Follow these instructions at home: Pay attention to any changes in your symptoms. Take these actions to help with your discomfort:  Take medicines only as told by your health care provider. ? If you were prescribed an antibiotic medicine, take it as told by your health care provider. Do not stop taking the antibiotic even if you start to feel better. ? Talk with your health care provider before you take a cough suppressant medicine.  Drink enough fluid to keep your urine clear or pale yellow.  If the air is dry, use a cold steam vaporizer or humidifier in your bedroom or your home to help loosen secretions.  Avoid anything that causes you to cough at work or at home.  If your cough is worse at night, try sleeping in a semi-upright position.  Avoid cigarette smoke. If you smoke, quit smoking. If you need help quitting,  ask your health care provider.  Avoid caffeine.  Avoid alcohol.  Rest as needed.  Contact a health care provider if:  You have new symptoms.  You cough up pus.  Your cough does not get better after 2-3 weeks, or your cough gets worse.  You cannot control your cough with suppressant medicines and you are losing sleep.  You develop pain that is getting worse or pain that is not controlled with pain medicines.  You have a fever.  You have unexplained weight loss.  You have night sweats. Get help right away if:  You cough up blood.  You have difficulty breathing.  Your heartbeat is very fast. This information is not intended to replace advice given to you by your health care provider. Make sure you discuss any questions you have with your health care provider. Document Released: 11/08/2010 Document Revised: 10/18/2015 Document Reviewed: 07/19/2014 Elsevier Interactive Patient Education  2018 [employer]vier Inc.  Upper Respiratory Infection, Adult Most upper respiratory infections (URIs) are caused by a virus. A URI affects the nose, throat, and upper air passages. The most common type of URI is often called "the common cold." Follow these instructions at home:  Take medicines only as told by your doctor.  Gargle warm saltwater or take cough drops to comfort your throat as told by your doctor.  Use a warm mist humidifier or inhale steam from a shower to increase air moisture. This may make it easier to breathe.  Drink enough  fluid to keep your pee (urine) clear or pale yellow.  Eat soups and other clear broths.  Have a healthy diet.  Rest as needed.  Go back to work when your fever is gone or your doctor says it is okay. ? You may need to stay home longer to avoid giving your URI to others. ? You can also wear a face mask and wash your hands often to prevent spread of the virus.  Use your inhaler more if you have asthma.  Do not use any tobacco products, including  cigarettes, chewing tobacco, or electronic cigarettes. If you need help quitting, ask your doctor. Contact a doctor if:  You are getting worse, not better.  Your symptoms are not helped by medicine.  You have chills.  You are getting more short of breath.  You have brown or red mucus.  You have yellow or brown discharge from your nose.  You have pain in your face, especially when you bend forward.  You have a fever.  You have puffy (swollen) neck glands.  You have pain while swallowing.  You have white areas in the back of your throat. Get help right away if:  You have very bad or constant: ? Headache. ? Ear pain. ? Pain in your forehead, behind your eyes, and over your cheekbones (sinus pain). ? Chest pain.  You have long-lasting (chronic) lung disease and any of the following: ? Wheezing. ? Long-lasting cough. ? Coughing up blood. ? A change in your usual mucus.  You have a stiff neck.  You have changes in your: ? Vision. ? Hearing. ? Thinking. ? Mood. This information is not intended to replace advice given to you by your health care provider. Make sure you discuss any questions you have with your health care provider. Document Released: 10/29/2007 Document Revised: 01/13/2016 Document Reviewed: 08/17/2013 Elsevier Interactive Patient Education  2018 [employer].   IF you received an x-ray today, you will receive an invoice from [facility]. Please contact [facility] at [phone] with questions or concerns regarding your invoice.   IF you received labwork today, you will receive an invoice from [facility]. Please contact [facility] at [phone] with questions or concerns regarding your invoice.   Our billing staff will not be able to assist you with questions regarding bills from these companies.  You will be contacted with the lab results as soon as they are available. The fastest way to get your results is to activate your My Chart  account. Instructions are located on the last page of this paperwork. If you have not heard from [place] regarding the results in 2 weeks, please contact this office.

## 2017-07-06 NOTE — Progress Notes (Signed)
Subjective:  By signing my name below, I, [name], attest that this documentation has been prepared under the direction and in the presence of [name], MD Electronically Signed: Charline Bills, ED Scribe 07/06/2017 at 11:46 AM.   Patient ID: Kevin Wang, male    DOB: 02/25/1971, 47 y.o.   MRN: [id number]  Chief Complaint  Patient presents with  . Cough    x 1 week  took cold medicine with DM   . Sore Throat   HPI Kevin Wang is a 47 y.o. male who presents to Primary Care at [facility] complaining of cough and sore throat. Last seen in Dec for CPE. Pt reports gradually worsening productive cough with yellow/clear phlegm x 1 week. States cough keeps him up at nigh. Also reports some nasal congestion. Pt has tried cough drops, DM cough syrup. Denies fever, wheezing, sob. No sick contacts.  There are no active problems to display for this patient.  Past Medical History:  Diagnosis Date  . Allergy   . Anxiety    No past surgical history on file. No Known Allergies Prior to Admission medications   Medication Sig Start Date End Date Taking? Authorizing Provider  ALPRAZolam (XANAX) 0.25 MG tablet Take 1 tablet (0.25 mg total) by mouth 2 (two) times daily as needed. for anxiety 05/08/17   [name], MD  sildenafil (VIAGRA) 100 MG tablet Take 0.5-1 tablets (50-100 mg total) by mouth daily as needed for erectile dysfunction. 05/08/17   [name], MD   Social History   Socioeconomic History  . Marital status: Married    Spouse name: Not on file  . Number of children: Not on file  . Years of education: Not on file  . Highest education level: Not on file  Social Needs  . Financial resource strain: Not on file  . Food insecurity - worry: Not on file  . Food insecurity - inability: Not on file  . Transportation needs - medical: Not on file  . Transportation needs - non-medical: Not on file  Occupational History  . Occupation: Electronics engineer: [employer]  Tobacco Use  . Smoking status: Never Smoker  . Smokeless tobacco: Never Used  Substance and Sexual Activity  . Alcohol use: No  . Drug use: No  . Sexual activity: Yes    Comment: number of sex partners in the last 12 months 1  Other Topics Concern  . Not on file  Social History Narrative   Exercise cardio and   Review of Systems  Constitutional: Negative for fever.  HENT: Positive for congestion and sore throat.   Respiratory: Positive for cough. Negative for shortness of breath and wheezing.       Objective:   Physical Exam  Constitutional: He is oriented to person, place, and time. He appears well-developed and well-nourished.  HENT:  Head: Normocephalic and atraumatic.  Right Ear: Tympanic membrane, external ear and ear canal normal.  Left Ear: Tympanic membrane, external ear and ear canal normal.  Nose: No rhinorrhea. Right sinus exhibits no maxillary sinus tenderness and no frontal sinus tenderness. Left sinus exhibits no maxillary sinus tenderness and no frontal sinus tenderness.  Mouth/Throat: Oropharynx is clear and moist and mucous membranes are normal. No oropharyngeal exudate or posterior oropharyngeal erythema.  Eyes: Conjunctivae are normal. Pupils are equal, round, and reactive to light.  Neck: Neck supple.  Cardiovascular: Normal rate, regular rhythm, normal heart sounds and intact distal pulses.  No  murmur heard. Pulmonary/Chest: Effort normal and breath sounds normal. He has no wheezes. He has no rhonchi. He has no rales.  Lungs are clear.  Abdominal: Soft. There is no tenderness.  Lymphadenopathy:    He has no cervical adenopathy.  Neurological: He is alert and oriented to person, place, and time.  Skin: Skin is warm and dry. No rash noted.  Psychiatric: He has a normal mood and affect. His behavior is normal.  Vitals reviewed.  Vitals:   07/06/17 1114  BP: 128/82  Pulse: 72  Resp: 18  Temp: 98.3 F (36.8 C)  TempSrc: Oral  SpO2: 98%    Weight: 222 lb 6.4 oz (100.9 kg)  Height: 5\' 10"  (1.778 m)      Assessment & Plan:    Kevin Wang is a 47 y.o. male Cough - Plan: HYDROcodone-homatropine (HYCODAN) 5-1.5 MG/5ML syrup, azithromycin (ZITHROMAX) 250 MG tablet  Acute upper respiratory infection  LRTI (lower respiratory tract infection) - Plan: azithromycin (ZITHROMAX) 250 MG tablet  Ongoing symptoms of a virus versus early lower respiratory secondary infection/bronchitis versus less likely early CAP. Lungs were clear, reassuring vitals.  -Symptomatic care discussed, Hycodan cough syrup if needed at bedtime, side effects discussed  -Z-Pak prescribed if not improving this week. RTC precautions if worse Meds ordered this encounter  Medications  . HYDROcodone-homatropine (HYCODAN) 5-1.5 MG/5ML syrup    Sig: 45m by mouth a bedtime as needed for cough.    Dispense:  120 mL    Refill:  0  . azithromycin (ZITHROMAX) 250 MG tablet    Sig: Take 2 pills by mouth on day 1, then 1 pill by mouth per day on days 2 through 5.    Dispense:  6 tablet    Refill:  0   Patient Instructions    Cough may still be due to virus. Those usually improve within 7-10 days. Cough syrup at night if needed, mucinex DM during the day. If not improving this week - can start antibiotic.   Return to the clinic or go to the nearest emergency room if any of your symptoms worsen or new symptoms occur.   Cough, Adult Coughing is a reflex that clears your throat and your airways. Coughing helps to heal and protect your lungs. It is normal to cough occasionally, but a cough that happens with other symptoms or lasts a long time may be a sign of a condition that needs treatment. A cough may last only 2-3 weeks (acute), or it may last longer than 8 weeks (chronic). What are the causes? Coughing is commonly caused by:  Breathing in substances that irritate your lungs.  A viral or bacterial respiratory infection.  Allergies.  Asthma.  Postnasal  drip.  Smoking.  Acid backing up from the stomach into the esophagus (gastroesophageal reflux).  Certain medicines.  Chronic lung problems, including COPD (or rarely, lung cancer).  Other medical conditions such as heart failure.  Follow these instructions at home: Pay attention to any changes in your symptoms. Take these actions to help with your discomfort:  Take medicines only as told by your health care provider. ? If you were prescribed an antibiotic medicine, take it as told by your health care provider. Do not stop taking the antibiotic even if you start to feel better. ? Talk with your health care provider before you take a cough suppressant medicine.  Drink enough fluid to keep your urine clear or pale yellow.  If the air is dry, use a cold  steam vaporizer or humidifier in your bedroom or your home to help loosen secretions.  Avoid anything that causes you to cough at work or at home.  If your cough is worse at night, try sleeping in a semi-upright position.  Avoid cigarette smoke. If you smoke, quit smoking. If you need help quitting, ask your health care provider.  Avoid caffeine.  Avoid alcohol.  Rest as needed.  Contact a health care provider if:  You have new symptoms.  You cough up pus.  Your cough does not get better after 2-3 weeks, or your cough gets worse.  You cannot control your cough with suppressant medicines and you are losing sleep.  You develop pain that is getting worse or pain that is not controlled with pain medicines.  You have a fever.  You have unexplained weight loss.  You have night sweats. Get help right away if:  You cough up blood.  You have difficulty breathing.  Your heartbeat is very fast. This information is not intended to replace advice given to you by your health care provider. Make sure you discuss any questions you have with your health care provider. Document Released: 11/08/2010 Document Revised: 10/18/2015  Document Reviewed: 07/19/2014 Elsevier Interactive Patient Education  2018 Elsevier Inc.  Upper Respiratory Infection, Adult Most upper respiratory infections (URIs) are caused by a virus. A URI affects the nose, throat, and upper air passages. The most common type of URI is often called "the common cold." Follow these instructions at home:  Take medicines only as told by your doctor.  Gargle warm saltwater or take cough drops to comfort your throat as told by your doctor.  Use a warm mist humidifier or inhale steam from a shower to increase air moisture. This may make it easier to breathe.  Drink enough fluid to keep your pee (urine) clear or pale yellow.  Eat soups and other clear broths.  Have a healthy diet.  Rest as needed.  Go back to work when your fever is gone or your doctor says it is okay. ? You may need to stay home longer to avoid giving your URI to others. ? You can also wear a face mask and wash your hands often to prevent spread of the virus.  Use your inhaler more if you have asthma.  Do not use any tobacco products, including cigarettes, chewing tobacco, or electronic cigarettes. If you need help quitting, ask your doctor. Contact a doctor if:  You are getting worse, not better.  Your symptoms are not helped by medicine.  You have chills.  You are getting more short of breath.  You have brown or red mucus.  You have yellow or brown discharge from your nose.  You have pain in your face, especially when you bend forward.  You have a fever.  You have puffy (swollen) neck glands.  You have pain while swallowing.  You have white areas in the back of your throat. Get help right away if:  You have very bad or constant: ? Headache. ? Ear pain. ? Pain in your forehead, behind your eyes, and over your cheekbones (sinus pain). ? Chest pain.  You have long-lasting (chronic) lung disease and any of the following: ? Wheezing. ? Long-lasting  cough. ? Coughing up blood. ? A change in your usual mucus.  You have a stiff neck.  You have changes in your: ? Vision. ? Hearing. ? Thinking. ? Mood. This information is not intended to replace advice given  to you by your health care provider. Make sure you discuss any questions you have with your health care provider. Document Released: 10/29/2007 Document Revised: 01/13/2016 Document Reviewed: 08/17/2013 Elsevier Interactive Patient Education  2018 [employer]   IF you received an x-ray today, you will receive an invoice from [facility]. Please contact [facility] at [phone] with questions or concerns regarding your invoice.   IF you received labwork today, you will receive an invoice from [facility]. Please contact [facility] at [phone] with questions or concerns regarding your invoice.   Our billing staff will not be able to assist you with questions regarding bills from these companies.  You will be contacted with the lab results as soon as they are available. The fastest way to get your results is to activate your My Chart account. Instructions are located on the last page of this paperwork. If you have not heard from us regarding the results in 2 weeks, please contact this office.      I personally performed the services described in this documentation, which was scribed in my presence. The recorded information has been reviewed and considered for accuracy and completeness, addended by me as needed, and agree with information above.  Signed,   Meredith StaggersJeffrey Nadeen Shipman, MD Primary Care at [facility].  07/08/17 10:22 PM

## 2017-07-10 ENCOUNTER — Telehealth: Payer: Self-pay | Admitting: Family Medicine

## 2017-07-10 ENCOUNTER — Ambulatory Visit: Payer: Self-pay | Admitting: *Deleted

## 2017-07-10 NOTE — Telephone Encounter (Signed)
Can try Zpak as planned. Return if worsening.  If bump in neck is enlarging, or not improving with the antibiotic in the next 7-10 days, can return for recheck.

## 2017-07-10 NOTE — Telephone Encounter (Signed)
Per chart, medication was sent on 07/06/17. Call placed to patient to make him aware, no answer on patient phone. Left message for patient to inform him.

## 2017-07-10 NOTE — Telephone Encounter (Signed)
LMOVM for pt - [facility] [address]

## 2017-07-10 NOTE — Telephone Encounter (Signed)
Per pt pharmacy has not received refill, [facility] [address]. Patient states its urgent he gets this med. Would like a call once sent [phone]

## 2017-07-10 NOTE — Telephone Encounter (Signed)
Pt received the hycodan syrup per pharmacist.  Jovita GammaGave verbal order for zpak.

## 2017-07-10 NOTE — Telephone Encounter (Signed)
Copied from CRM [phone]. Topic: Inquiry >> Jul 10, 2017  8:11 AM Maia Pettiesrtiz, Kristie S wrote: Reason for CRM: pt states he was advised to call in a few days if not feeling better and zpack would be sent in. Please advise.  [facility] [phone] - [place], [place] - [address] AT [facility] [address] [phone] (Phone) [phone] (Fax)

## 2017-07-10 NOTE — Telephone Encounter (Signed)
Pt called with c/o swollen lymph node on the right side of neck. He states he had been to see his pcp on Monday and was prescribed a zpack and cough medicine. But was instructed not to start on the zpack but wait to see if he got any better. So today he is on his way to get the zpack.  He is will start on the zpack today.  He denies fever, runny nose, and chills. Feel some pressure in his head. He thinks it is a sinus infection. Home care advice given to him and also instructed that if he started running a fever, difficulty breathing, sore throat, and prod cough to give us a call back. Pt voiced understanding.  Answer Assessment - Initial Assessment Questions 1. LOCATION: "Where is the swollen node located?" "Is the matching node on the other side of the body also swollen?"      On the right side 2. SIZE: "How big is the node?" (Inches or centimeters) (or compare to common objects such as pea, bean, marble, golf ball)      Not sure 3. ONSET: "When did the swelling start?"      today 4. NECK NODES: "Is there a sore throat, runny nose or other symptoms of a cold?"      Cough 5. GROIN OR ARMPIT NODES: "Is there a sore, scratch, cut or painful red area on that arm or leg?"      n/a 6. FEVER: "Do you have a fever?" If so, ask: "What is it, how was it measured, and when did it start?"      no 7. CAUSE: "What do you think is causing the swollen lymph nodes?"     Sinus infection? 8. OTHER SYMPTOMS: "Do you have any other symptoms?"     cough 9. PREGNANCY: "Is there any chance you are pregnant?" "When was your last menstrual period?"     n/a  Protocols used: LYMPH NODES SWOLLEN-A-AH

## 2017-07-10 NOTE — Telephone Encounter (Signed)
For your review and advisement

## 2017-07-11 NOTE — Telephone Encounter (Signed)
LMOVM with Dr. Greene's message 

## 2017-07-14 NOTE — Telephone Encounter (Signed)
Copied from CRM [phone]. >> Jul 14, 2017 11:51 AM Raquel SarnaHayes, Teresa G wrote: Pt took last Zpack pill this morning.  He was suppose to take it last night but he lost the pill.  Pt has only taken the 4 out of 5 pills. Please call pt to let him know what he needs to do asap.

## 2017-07-14 NOTE — Telephone Encounter (Signed)
Copied from CRM [phone]. Topic: Inquiry >> Jul 14, 2017 11:51 AM Raquel SarnaHayes, Teresa G wrote: Pt took last Zpack pill this morning.  He was suppose to take it last night but he lost the pill.  Pt has only taken the 4 out of 5 pills. Please call pt to let him know what he needs to do asap.

## 2017-07-15 NOTE — Telephone Encounter (Signed)
Spoke with pt.  Advised to watch the bump on his neck - Abx will be in his system 10 days.  At the end of that time (5 more days) if the bump is larger or painful, call and make appt with Dr. Neva SeatGreene. Pt verbalized understanding.

## 2017-07-16 ENCOUNTER — Encounter: Payer: Self-pay | Admitting: Family Medicine

## 2017-12-28 ENCOUNTER — Ambulatory Visit: Payer: Self-pay

## 2017-12-28 ENCOUNTER — Other Ambulatory Visit: Payer: Self-pay | Admitting: Occupational Medicine

## 2017-12-28 DIAGNOSIS — Z0000 Encounter for general adult medical examination without abnormal findings: Secondary | ICD-10-CM

## 2018-01-11 ENCOUNTER — Ambulatory Visit (INDEPENDENT_AMBULATORY_CARE_PROVIDER_SITE_OTHER): Payer: BLUE CROSS/BLUE SHIELD | Admitting: Family Medicine

## 2018-01-11 ENCOUNTER — Encounter: Payer: Self-pay | Admitting: Family Medicine

## 2018-01-11 ENCOUNTER — Other Ambulatory Visit: Payer: Self-pay

## 2018-01-11 VITALS — BP 146/90 | HR 68 | Temp 98.1°F | Ht 69.0 in | Wt 225.6 lb

## 2018-01-11 DIAGNOSIS — L84 Corns and callosities: Secondary | ICD-10-CM | POA: Diagnosis not present

## 2018-01-11 DIAGNOSIS — Z5181 Encounter for therapeutic drug level monitoring: Secondary | ICD-10-CM | POA: Diagnosis not present

## 2018-01-11 DIAGNOSIS — B351 Tinea unguium: Secondary | ICD-10-CM | POA: Diagnosis not present

## 2018-01-11 DIAGNOSIS — Z113 Encounter for screening for infections with a predominantly sexual mode of transmission: Secondary | ICD-10-CM

## 2018-01-11 MED ORDER — TERBINAFINE HCL 250 MG PO TABS: 250.0000 mg | ORAL_TABLET | Freq: Every day | ORAL | 0 refills | Status: DC

## 2018-01-11 NOTE — Patient Instructions (Addendum)
STI testing today, but can be repeated in 6 weeks and 6 months for repeat testing. Wear condoms every time if possible.   For toenails - lamisil once per day and recheck levels in 6 weeks.   Try wider toe box shoe to lessen callous on toes.   Please follow-up next few weeks if possible to discuss the ongoing urinary symptoms further.  I would like to review previous labs or work-up of the symptoms but can certainly discuss other testing or evaluation.  Thank you for coming in today. Return to the clinic or go to the nearest emergency room if any of your symptoms worsen or new symptoms occur.  Fungal Nail Infection Fungal nail infection is a common fungal infection of the toenails or fingernails. This condition affects toenails more often than fingernails. More than one nail may be infected. The condition can be passed from person to person (is contagious). What are the causes? This condition is caused by a fungus. Several types of funguses can cause the infection. These funguses are common in moist and warm areas. If your hands or feet come into contact with the fungus, it may get into a crack in your fingernail or toenail and cause the infection. What increases the risk? The following factors may make you more likely to develop this condition:  Being male.  Having diabetes.  Being of older age.  Living with someone who has the fungus.  Walking barefoot in areas where the fungus thrives, such as showers or locker rooms.  Having poor circulation.  Wearing shoes and socks that cause your feet to sweat.  Having athlete's foot.  Having a nail injury or history of a recent nail surgery.  Having psoriasis.  Having a weak body defense system (immune system).  What are the signs or symptoms? Symptoms of this condition include:  A pale spot on the nail.  Thickening of the nail.  A nail that becomes yellow or brown.  A brittle or ragged nail edge.  A crumbling nail.  A nail  that has lifted away from the nail bed.  How is this diagnosed? This condition is diagnosed with a physical exam. Your health care provider may take a scraping or clipping from your nail to test for the fungus. How is this treated? Mild infections do not need treatment. If you have significant nail changes, treatment may include:  Oral antifungal medicines. You may need to take the medicine for several weeks or several months, and you may not see the results for a long time. These medicines can cause side effects. Ask your health care provider what problems to watch for.  Antifungal nail polish and nail cream. These may be used along with oral antifungal medicines.  Laser treatment of the nail.  Surgery to remove the nail. This may be needed for the most severe infections.  Treatment takes a long time, and the infection may come back. Follow these instructions at home: Medicines  Take or apply over-the-counter and prescription medicines only as told by your health care provider.  Ask your health care provider about using over-the-counter mentholated ointment on your nails. Lifestyle   Do not share personal items, such as towels or nail clippers.  Trim your nails often.  Wash and dry your hands and feet every day.  Wear absorbent socks, and change your socks frequently.  Wear shoes that allow air to circulate, such as sandals or canvas tennis shoes. Throw out old shoes.  Wear rubber gloves if  you are working with your hands in wet areas.  Do not walk barefoot in shower rooms or locker rooms.  Do not use a nail salon that does not use clean instruments.  Do not use artificial nails. General instructions  Keep all follow-up visits as told by your health care provider. This is important.  Use antifungal foot powder on your feet and in your shoes. Contact a health care provider if: Your infection is not getting better or it is getting worse after several months. This  information is not intended to replace advice given to you by your health care provider. Make sure you discuss any questions you have with your health care provider. Document Released: 05/09/2000 Document Revised: 10/18/2015 Document Reviewed: 11/13/2014 Elsevier Interactive Patient Education  [employer]2018 Elsevier Inc.   If you have lab work done today you will be contacted with your lab results within the next 2 weeks.  If you have not heard from us then please contact us. The fastest way to get your results is to register for My Chart.   IF you received an x-ray today, you will receive an invoice from [facility]. Please contact [facility] at [phone] with questions or concerns regarding your invoice.   IF you received labwork today, you will receive an invoice from [facility]. Please contact [facility] at [phone] with questions or concerns regarding your invoice.   Our billing staff will not be able to assist you with questions regarding bills from these companies.  You will be contacted with the lab results as soon as they are available. The fastest way to get your results is to activate your My Chart account. Instructions are located on the last page of this paperwork. If you have not heard from us regarding the results in 2 weeks, please contact this office.

## 2018-01-11 NOTE — Progress Notes (Addendum)
Subjective:    Patient ID: Kevin Wang, male    DOB: 06/10/1970, 47 y.o.   MRN: [id number]  HPI Kevin Wang is a 47 y.o. male Presents today for: Chief Complaint  Patient presents with  . STD screening    new partner   . Nail Problem    toenails are thicker    New sexual partner, with unprotected intercourse last weekend.  No known STI exposure, but wants to be on the safe side. Prior new partner a week prior (2 total partners since last testing). No penile d/c, ni dysuria, no rash. No prior STI.   Also with thickened/discolored nails on left great toe, 3rd toenail on right. No treatments.   Callous on great toes - for years, some wide shoes, but not all shoes.   At end of visit - notes urinary dribbling for years after prior urology eval - plans on scheduling separate visit for this issue.   There are no active problems to display for this patient.  Past Medical History:  Diagnosis Date  . Allergy   . Anxiety    No past surgical history on file. No Known Allergies Prior to Admission medications   Medication Sig Start Date End Date Taking? Authorizing Provider  ALPRAZolam (XANAX) 0.25 MG tablet Take 1 tablet (0.25 mg total) by mouth 2 (two) times daily as needed. for anxiety 05/08/17  Yes Shade FloodGreene, Demecia Northway R, MD  sildenafil (VIAGRA) 100 MG tablet Take 0.5-1 tablets (50-100 mg total) by mouth daily as needed for erectile dysfunction. 05/08/17  Yes Shade FloodGreene, Georgie Eduardo R, MD   Social History   Socioeconomic History  . Marital status: Married    Spouse name: Not on file  . Number of children: Not on file  . Years of education: Not on file  . Highest education level: Not on file  Occupational History  . Occupation: Electronics engineerauditor    Employer: [employer]  Social Needs  . Financial resource strain: Not on file  . Food insecurity:    Worry: Not on file    Inability: Not on file  . Transportation needs:    Medical: Not on file    Non-medical: Not on file  Tobacco Use  .  Smoking status: Never Smoker  . Smokeless tobacco: Never Used  Substance and Sexual Activity  . Alcohol use: No  . Drug use: No  . Sexual activity: Yes    Comment: number of sex partners in the last 12 months 1  Lifestyle  . Physical activity:    Days per week: Not on file    Minutes per session: Not on file  . Stress: Not on file  Relationships  . Social connections:    Talks on phone: Not on file    Gets together: Not on file    Attends religious service: Not on file    Active member of club or organization: Not on file    Attends meetings of clubs or organizations: Not on file    Relationship status: Not on file  . Intimate partner violence:    Fear of current or ex partner: Not on file    Emotionally abused: Not on file    Physically abused: Not on file    Forced sexual activity: Not on file  Other Topics Concern  . Not on file  Social History Narrative   Exercise cardio and      Review of Systems Per hpi.     Objective:   Physical Exam  Constitutional: He is oriented to person, place, and time. He appears well-developed and well-nourished. No distress.  HENT:  Head: Normocephalic and atraumatic.  Cardiovascular: Normal rate.  Pulmonary/Chest: Effort normal.  Abdominal: He exhibits no distension. There is no tenderness.  Neurological: He is alert and oriented to person, place, and time.  Skin:  Thickened discolored nail, short nail on the left great toenail, right third toenail also thickened, slightly discolored.  He has significant callus at the medial plantar surface right great toe greater than left.  No surrounding erythema, minimal dry skin between the toes without significant breakdown  Psychiatric: He has a normal mood and affect.   Vitals:   01/11/18 1623 01/11/18 1626  BP: (!) 159/88 (!) 146/90  Pulse: 68   Temp: 98.1 F (36.7 C)   TempSrc: Oral   SpO2: 97%   Weight: 225 lb 9.6 oz (102.3 kg)   Height: 5\' 9"  (1.753 m)        Assessment &  Plan:  Kevin Wang is a 47 y.o. male Onychomycosis - Plan: Comprehensive metabolic panel, terbinafine (LAMISIL) 250 MG tablet Medication monitoring encounter - Plan: Comprehensive metabolic panel  -Options discussed, chose Lamisil.  Check baseline LFTs, recheck levels in 6 weeks with repeat exam  Routine screening for STI (sexually transmitted infection) - Plan: GC/Chlamydia Probe Amp, HIV antibody, RPR  -Tested above, safer sex practices discussed.  With recent unprotected intercourse, plans to repeat testing in 6 weeks, then possibly 6 months  Callus of foot  -Suspect he has a wide forefoot, and shoe with a wider toe box may be helpful.  Option of paring callus or evaluation with podiatry discussed.  At end of visit he did note some urinary dribbling at times for years possibly related to prior Foley catheter.  Denied recent changes in symptoms but plans to follow-up to discuss further.\  Borderline/elevated BP - recheck next visit for above.   Meds ordered this encounter  Medications  . terbinafine (LAMISIL) 250 MG tablet    Sig: Take 1 tablet (250 mg total) by mouth daily.    Dispense:  90 tablet    Refill:  0   Patient Instructions    STI testing today, but can be repeated in 6 weeks and 6 months for repeat testing. Wear condoms every time if possible.   For toenails - lamisil once per day and recheck levels in 6 weeks.   Try wider toe box shoe to lessen callous on toes.   Please follow-up next few weeks if possible to discuss the ongoing urinary symptoms further.  I would like to review previous labs or work-up of the symptoms but can certainly discuss other testing or evaluation.  Thank you for coming in today. Return to the clinic or go to the nearest emergency room if any of your symptoms worsen or new symptoms occur.  Fungal Nail Infection Fungal nail infection is a common fungal infection of the toenails or fingernails. This condition affects toenails more often than  fingernails. More than one nail may be infected. The condition can be passed from person to person (is contagious). What are the causes? This condition is caused by a fungus. Several types of funguses can cause the infection. These funguses are common in moist and warm areas. If your hands or feet come into contact with the fungus, it may get into a crack in your fingernail or toenail and cause the infection. What increases the risk? The following factors may make you more likely  to develop this condition:  Being male.  Having diabetes.  Being of older age.  Living with someone who has the fungus.  Walking barefoot in areas where the fungus thrives, such as showers or locker rooms.  Having poor circulation.  Wearing shoes and socks that cause your feet to sweat.  Having athlete's foot.  Having a nail injury or history of a recent nail surgery.  Having psoriasis.  Having a weak body defense system (immune system).  What are the signs or symptoms? Symptoms of this condition include:  A pale spot on the nail.  Thickening of the nail.  A nail that becomes yellow or brown.  A brittle or ragged nail edge.  A crumbling nail.  A nail that has lifted away from the nail bed.  How is this diagnosed? This condition is diagnosed with a physical exam. Your health care provider may take a scraping or clipping from your nail to test for the fungus. How is this treated? Mild infections do not need treatment. If you have significant nail changes, treatment may include:  Oral antifungal medicines. You may need to take the medicine for several weeks or several months, and you may not see the results for a long time. These medicines can cause side effects. Ask your health care provider what problems to watch for.  Antifungal nail polish and nail cream. These may be used along with oral antifungal medicines.  Laser treatment of the nail.  Surgery to remove the nail. This may be needed  for the most severe infections.  Treatment takes a long time, and the infection may come back. Follow these instructions at home: Medicines  Take or apply over-the-counter and prescription medicines only as told by your health care provider.  Ask your health care provider about using over-the-counter mentholated ointment on your nails. Lifestyle   Do not share personal items, such as towels or nail clippers.  Trim your nails often.  Wash and dry your hands and feet every day.  Wear absorbent socks, and change your socks frequently.  Wear shoes that allow air to circulate, such as sandals or canvas tennis shoes. Throw out old shoes.  Wear rubber gloves if you are working with your hands in wet areas.  Do not walk barefoot in shower rooms or locker rooms.  Do not use a nail salon that does not use clean instruments.  Do not use artificial nails. General instructions  Keep all follow-up visits as told by your health care provider. This is important.  Use antifungal foot powder on your feet and in your shoes. Contact a health care provider if: Your infection is not getting better or it is getting worse after several months. This information is not intended to replace advice given to you by your health care provider. Make sure you discuss any questions you have with your health care provider. Document Released: 05/09/2000 Document Revised: 10/18/2015 Document Reviewed: 11/13/2014 Elsevier Interactive Patient Education  [employer].   If you have lab work done today you will be contacted with your lab results within the next 2 weeks.  If you have not heard from [place] then please contact us. The fastest way to get your results is to register for My Chart.   IF you received an x-ray today, you will receive an invoice from [facility]. Please contact [facility] at [phone] with questions or concerns regarding your invoice.   IF you received labwork  today, you will receive an invoice  from [facility]. Please contact [facility] at [phone] with questions or concerns regarding your invoice.   Our billing staff will not be able to assist you with questions regarding bills from these companies.  You will be contacted with the lab results as soon as they are available. The fastest way to get your results is to activate your My Chart account. Instructions are located on the last page of this paperwork. If you have not heard from [place] regarding the results in 2 weeks, please contact this office.       Signed,   Meredith Staggers, MD Primary Care at [facility].  01/11/18 6:27 PM

## 2018-01-12 LAB — COMPREHENSIVE METABOLIC PANEL
ALT: 17 IU/L (ref 0–44)
AST: 20 IU/L (ref 0–40)
Albumin/Globulin Ratio: 1.6 (ref 1.2–2.2)
Albumin: 4.2 g/dL (ref 3.5–5.5)
Alkaline Phosphatase: 63 IU/L (ref 39–117)
BUN/Creatinine Ratio: 16 (ref 9–20)
BUN: 17 mg/dL (ref 6–24)
Bilirubin Total: 0.2 mg/dL (ref 0.0–1.2)
CO2: 25 mmol/L (ref 20–29)
CREATININE: 1.05 mg/dL (ref 0.76–1.27)
Calcium: 9.6 mg/dL (ref 8.7–10.2)
Chloride: 105 mmol/L (ref 96–106)
GFR, EST AFRICAN AMERICAN: 97 mL/min/{1.73_m2} (ref >59)
GFR, EST NON AFRICAN AMERICAN: 84 mL/min/{1.73_m2} (ref >59)
GLUCOSE: 92 mg/dL (ref 65–99)
Globulin, Total: 2.6 g/dL (ref 1.5–4.5)
Potassium: 4.4 mmol/L (ref 3.5–5.2)
Sodium: 143 mmol/L (ref 134–144)
TOTAL PROTEIN: 6.8 g/dL (ref 6.0–8.5)

## 2018-01-12 LAB — GC/CHLAMYDIA PROBE AMP
Chlamydia trachomatis, NAA: NEGATIVE
Neisseria gonorrhoeae by PCR: NEGATIVE

## 2018-01-12 LAB — RPR: RPR: NONREACTIVE

## 2018-01-12 LAB — HIV ANTIBODY (ROUTINE TESTING W REFLEX): HIV SCREEN 4TH GENERATION: NONREACTIVE

## 2018-02-05 ENCOUNTER — Telehealth: Payer: Self-pay | Admitting: Family Medicine

## 2018-02-05 DIAGNOSIS — N529 Male erectile dysfunction, unspecified: Secondary | ICD-10-CM

## 2018-02-05 NOTE — Telephone Encounter (Signed)
Copied from CRM [id number]. Topic: Quick Communication - Rx Refill/Question >> Feb 05, 2018 12:41 PM Oneal GroutSebastian, Jennifer S wrote: Medication: sildenafil (VIAGRA) 100 MG tablet   Has the patient contacted their pharmacy? Yes.   (Agent: If no, request that the patient contact the pharmacy for the refill.) (Agent: If yes, when and what did the pharmacy advise?)  Preferred Pharmacy (with phone number or street name): [facility] [address]   Agent: Please be advised that RX refills may take up to 3 business days. We ask that you follow-up with your pharmacy.

## 2018-02-05 NOTE — Telephone Encounter (Signed)
Sildenafil refill Last refill:05/08/17 5 tab/3 refill Last OV:05/08/17 ONG:[id number]:[id number] [place]- [place], [place] [address] [facility] [address] [facility] [address] [phone] (Phone) [phone] (Fax)

## 2018-02-07 MED ORDER — SILDENAFIL CITRATE 100 MG PO TABS: 50.0000 mg | ORAL_TABLET | Freq: Every day | ORAL | 3 refills | Status: DC | PRN

## 2018-02-07 NOTE — Telephone Encounter (Signed)
Discussed 04/2017, has appt in October. Refilled.

## 2018-02-15 NOTE — Telephone Encounter (Signed)
Pt is calling back checking on sildenafil refil

## 2018-02-15 NOTE — Telephone Encounter (Signed)
Patient advised.

## 2018-02-23 ENCOUNTER — Other Ambulatory Visit: Payer: Self-pay

## 2018-02-23 ENCOUNTER — Encounter: Payer: Self-pay | Admitting: Family Medicine

## 2018-02-23 ENCOUNTER — Other Ambulatory Visit: Payer: Self-pay | Admitting: Sports Medicine

## 2018-02-23 ENCOUNTER — Ambulatory Visit (INDEPENDENT_AMBULATORY_CARE_PROVIDER_SITE_OTHER): Payer: Managed Care, Other (non HMO) | Admitting: Family Medicine

## 2018-02-23 VITALS — BP 154/82 | HR 77 | Temp 98.0°F | Resp 16 | Ht 69.29 in | Wt 221.0 lb

## 2018-02-23 DIAGNOSIS — M545 Low back pain, unspecified: Secondary | ICD-10-CM

## 2018-02-23 DIAGNOSIS — L84 Corns and callosities: Secondary | ICD-10-CM | POA: Diagnosis not present

## 2018-02-23 DIAGNOSIS — Z5181 Encounter for therapeutic drug level monitoring: Secondary | ICD-10-CM | POA: Diagnosis not present

## 2018-02-23 DIAGNOSIS — N529 Male erectile dysfunction, unspecified: Secondary | ICD-10-CM

## 2018-02-23 DIAGNOSIS — B351 Tinea unguium: Secondary | ICD-10-CM

## 2018-02-23 DIAGNOSIS — R351 Nocturia: Secondary | ICD-10-CM

## 2018-02-23 DIAGNOSIS — N3943 Post-void dribbling: Secondary | ICD-10-CM

## 2018-02-23 MED ORDER — SILDENAFIL CITRATE 100 MG PO TABS: 50.0000 mg | ORAL_TABLET | Freq: Every day | ORAL | 3 refills | Status: DC | PRN

## 2018-02-23 MED ORDER — TERBINAFINE HCL 250 MG PO TABS: 250.0000 mg | ORAL_TABLET | Freq: Every day | ORAL | 0 refills | Status: DC

## 2018-02-23 NOTE — Progress Notes (Signed)
Subjective:  By signing my name below, I, Stann Ore, attest that this documentation has been prepared under the direction and in the presence of Meredith Staggers, MD. Electronically Signed: Stann Ore, Scribe. 02/23/2018 , 1:48 PM .  Patient was seen in Room 8 .   Patient ID: Kevin Wang, male    DOB: March 17, 1971, 47 y.o.   MRN: [id number] Chief Complaint  Patient presents with  . Nail Problem    6 weeks follow-up    HPI Kaelin Bonelli is a 47 y.o. male Here for onychomycosis and urinary dribbling.   Patient mentions he's gone to orthopedist for bursitis and sciatica; will have MRI done this Saturday (Oct 5th).   Onychomycosis He was started on Lamisil 250 mg qd on Aug 19th.   Lab Results  Component Value Date   ALT 17 01/11/2018   AST 20 01/11/2018   ALKPHOS 63 01/11/2018   BILITOT 0.2 01/11/2018   Discussed wearing a wider toe box shoe due to callus distribution. Patient hasn't noticed additional fungus growth, and also believes areas are getting clearer.    Urinary dribbling Patient briefly mentioned at the end of the last visit. Patient states he was seen by urologist approximately 10 years ago after having blood in stool. He had an urinary catheter done. But since then, when he urinates, he's been having dribbling post urination. He notes nocturia once a night. He denies any trouble starting the stream, emptying the stream, dysuria, difficulty urinating, frequency or hematuria. He denies any family history of prostate cancer.    Erectile dysfunction He also requests medication refill of his sildenafil. He states taking half pill of sildenafil. He denies chest pain, shortness of breath, blue hue to vision or hearing loss. He denies history of heart disease.   There are no active problems to display for this patient.  Past Medical History:  Diagnosis Date  . Allergy   . Anxiety    History reviewed. No pertinent surgical history. No Known Allergies Prior to  Admission medications   Medication Sig Start Date End Date Taking? Authorizing Provider  ALPRAZolam (XANAX) 0.25 MG tablet Take 1 tablet (0.25 mg total) by mouth 2 (two) times daily as needed. for anxiety 05/08/17  Yes [name], MD  sildenafil (VIAGRA) 100 MG tablet Take 0.5-1 tablets (50-100 mg total) by mouth daily as needed for erectile dysfunction. 02/07/18  Yes [name], MD  terbinafine (LAMISIL) 250 MG tablet Take 1 tablet (250 mg total) by mouth daily. 01/11/18  Yes [name], MD   Social History   Socioeconomic History  . Marital status: Married    Spouse name: Not on file  . Number of children: Not on file  . Years of education: Not on file  . Highest education level: Not on file  Occupational History  . Occupation: Electronics engineer: [employer]  Social Needs  . Financial resource strain: Not on file  . Food insecurity:    Worry: Not on file    Inability: Not on file  . Transportation needs:    Medical: Not on file    Non-medical: Not on file  Tobacco Use  . Smoking status: Never Smoker  . Smokeless tobacco: Never Used  Substance and Sexual Activity  . Alcohol use: No  . Drug use: No  . Sexual activity: Yes    Comment: number of sex partners in the last 12 months 1  Lifestyle  . Physical activity:  Days per week: Not on file    Minutes per session: Not on file  . Stress: Not on file  Relationships  . Social connections:    Talks on phone: Not on file    Gets together: Not on file    Attends religious service: Not on file    Active member of club or organization: Not on file    Attends meetings of clubs or organizations: Not on file    Relationship status: Not on file  . Intimate partner violence:    Fear of current or ex partner: Not on file    Emotionally abused: Not on file    Physically abused: Not on file    Forced sexual activity: Not on file  Other Topics Concern  . Not on file  Social History Narrative   Exercise  cardio and   Review of Systems  Constitutional: Negative for fatigue and unexpected weight change.  Eyes: Negative for visual disturbance.  Respiratory: Negative for cough, chest tightness and shortness of breath.   Cardiovascular: Negative for chest pain, palpitations and leg swelling.  Gastrointestinal: Negative for abdominal pain and blood in stool.  Genitourinary: Negative for difficulty urinating, dysuria, frequency, hematuria and urgency.       Urinary dribbling  Neurological: Negative for dizziness, light-headedness and headaches.       Objective:   Physical Exam  Constitutional: He is oriented to person, place, and time. He appears well-developed and well-nourished. No distress.  HENT:  Head: Normocephalic and atraumatic.  Eyes: Pupils are equal, round, and reactive to light. EOM are normal.  Neck: Neck supple.  Cardiovascular: Normal rate.  Pulmonary/Chest: Effort normal. No respiratory distress.  Musculoskeletal: Normal range of motion.  Neurological: He is alert and oriented to person, place, and time.  Skin: Skin is warm and dry.  Significant callus of the medial great toes bilaterally, R>L Right foot: 3rd toenail is thickened, slightly discolored small edge of the great toenail on the right thickening Left foot: slightly thickened nail bed with somewhat retraction of proximal nailfold at the left great toe nail; remaining toenails intact  Psychiatric: He has a normal mood and affect. His behavior is normal.  Nursing note and vitals reviewed.   Vitals:   02/23/18 1320  BP: (!) 154/82  Pulse: 77  Resp: 16  Temp: 98 F (36.7 C)  TempSrc: Oral  SpO2: 97%  Weight: 221 lb (100.2 kg)  Height: 5' 9.29" (1.76 m)       Assessment & Plan:   Cadin Luka is a 47 y.o. male Onychomycosis - Plan: terbinafine (LAMISIL) 250 MG tablet, Ambulatory referral to Podiatry Medication monitoring encounter - Plan: Hepatic Function Panel Callus of foot - Plan: Ambulatory  referral to Podiatry  -Continue Lamisil, check LFTs.  Will also refer to podiatry for evaluation of callus as well as the dysmorphic nail bed l great toe and recommendations.   Nocturia - Plan: PSA, Ambulatory referral to Urology Dribbling following urination - Plan: PSA, Ambulatory referral to Urology  -Long-standing symptoms have previous Foley catheterization.  Will check PSA, refer to urology to determine if further testing indicated.  DRE deferred today as likely will be performed at urology visit.  Erectile dysfunction, unspecified erectile dysfunction type - Plan: sildenafil (VIAGRA) 100 MG tablet  - viagra Rx given - use lowest effective dose. Side effects discussed (including but not limited to headache/flushing, blue discoloration of vision, possible vascular steal and risk of cardiac effects if underlying unknown coronary artery disease,  and permanent sensorineural hearing loss). Understanding expressed.   Meds ordered this encounter  Medications  . terbinafine (LAMISIL) 250 MG tablet    Sig: Take 1 tablet (250 mg total) by mouth daily.    Dispense:  90 tablet    Refill:  0  . sildenafil (VIAGRA) 100 MG tablet    Sig: Take 0.5-1 tablets (50-100 mg total) by mouth daily as needed for erectile dysfunction.    Dispense:  5 tablet    Refill:  3   Patient Instructions   I will refer you to urology to discuss urinary symptoms further as well as check PSA test.  Continue Lamisil for now, I will check the liver test.  Once the nail clears up can stop the medication, but I am also referring you to podiatry.  If you have lab work done today you will be contacted with your lab results within the next 2 weeks.  If you have not heard from [place] then please contact us. The fastest way to get your results is to register for My Chart.   IF you received an x-ray today, you will receive an invoice from [facility]. Please contact [facility] at [phone] with questions or  concerns regarding your invoice.   IF you received labwork today, you will receive an invoice from [facility]. Please contact [facility] at [phone] with questions or concerns regarding your invoice.   Our billing staff will not be able to assist you with questions regarding bills from these companies.  You will be contacted with the lab results as soon as they are available. The fastest way to get your results is to activate your My Chart account. Instructions are located on the last page of this paperwork. If you have not heard from [place] regarding the results in 2 weeks, please contact this office.       I personally performed the services described in this documentation, which was scribed in my presence. The recorded information has been reviewed and considered for accuracy and completeness, addended by me as needed, and agree with information above.  Signed,   Meredith Staggers, MD Primary Care at [facility].  02/23/18 1:58 PM

## 2018-02-23 NOTE — Patient Instructions (Addendum)
I will refer you to urology to discuss urinary symptoms further as well as check PSA test.  Continue Lamisil for now, I will check the liver test.  Once the nail clears up can stop the medication, but I am also referring you to podiatry.  If you have lab work done today you will be contacted with your lab results within the next 2 weeks.  If you have not heard from [place] then please contact us. The fastest way to get your results is to register for My Chart.   IF you received an x-ray today, you will receive an invoice from [facility]. Please contact [facility] Radiology at [phone] with questions or concerns regarding your invoice.   IF you received labwork today, you will receive an invoice from [place]. Please contact [facility] at [phone] with questions or concerns regarding your invoice.   Our billing staff will not be able to assist you with questions regarding bills from these companies.  You will be contacted with the lab results as soon as they are available. The fastest way to get your results is to activate your My Chart account. Instructions are located on the last page of this paperwork. If you have not heard from [place] regarding the results in 2 weeks, please contact this office.

## 2018-02-24 LAB — HEPATIC FUNCTION PANEL
ALT: 20 IU/L (ref 0–44)
AST: 22 IU/L (ref 0–40)
Albumin: 4.1 g/dL (ref 3.5–5.5)
Alkaline Phosphatase: 60 IU/L (ref 39–117)
BILIRUBIN TOTAL: 0.3 mg/dL (ref 0.0–1.2)
Bilirubin, Direct: 0.1 mg/dL (ref 0.00–0.40)
Total Protein: 6.5 g/dL (ref 6.0–8.5)

## 2018-02-24 LAB — PSA: Prostate Specific Ag, Serum: 1.4 ng/mL (ref 0.0–4.0)

## 2018-02-27 ENCOUNTER — Other Ambulatory Visit: Payer: Self-pay

## 2018-03-01 ENCOUNTER — Encounter: Payer: Self-pay | Admitting: *Deleted

## 2018-03-09 ENCOUNTER — Other Ambulatory Visit: Payer: Self-pay

## 2018-03-10 ENCOUNTER — Encounter: Payer: Self-pay | Admitting: Podiatry

## 2018-03-10 ENCOUNTER — Ambulatory Visit: Payer: Managed Care, Other (non HMO) | Admitting: Podiatry

## 2018-03-10 VITALS — BP 132/88

## 2018-03-10 DIAGNOSIS — L989 Disorder of the skin and subcutaneous tissue, unspecified: Secondary | ICD-10-CM | POA: Diagnosis not present

## 2018-03-10 DIAGNOSIS — B351 Tinea unguium: Secondary | ICD-10-CM | POA: Diagnosis not present

## 2018-03-10 DIAGNOSIS — L603 Nail dystrophy: Secondary | ICD-10-CM

## 2018-03-10 MED ORDER — GENTAMICIN SULFATE 0.1 % EX CREA: 1.0000 "application " | TOPICAL_CREAM | Freq: Two times a day (BID) | CUTANEOUS | 0 refills | Status: DC

## 2018-03-10 NOTE — Patient Instructions (Signed)
The day after your procedure:  Place 1/4 cup of epsom salts in a quart of warm tap water.  Submerge your foot or feet in the solution and soak for 20 minutes.  This soak should be done twice a day.  Next, remove your foot or feet from solution, blot dry the affected area. Apply ointment and cover if instructed by your doctor.   IF YOUR SKIN BECOMES IRRITATED WHILE USING THESE INSTRUCTIONS, IT IS OKAY TO SWITCH TO  WHITE VINEGAR AND WATER.  As another alternative soak, you may use antibacterial soap and water.  Monitor for any signs/symptoms of infection. Call the office immediately if any occur or go directly to the emergency room. Call with any questions/concerns.    

## 2018-03-18 NOTE — Progress Notes (Signed)
   Subjective: 47 year old male presenting today as a new patient with a chief complaint of possible fungal nails that have been present for the past several months. He states some of his nails are lifting from the nail bed. He also reports a painful callus lesion noted to bilateral great toes. Walking increases the callus pain and he denies modifying factors of the nails. He has not done anything for treatment of the calluses but has been taking Lamisil for the toenails prescribed by his PCP. Patient is here for further evaluation and treatment.   Past Medical History:  Diagnosis Date  . Allergy   . Anxiety     Objective:  General: Well developed, nourished, in no acute distress, alert and oriented x3   Dermatology: Hyperkeratotic, discolored, thickened, onychodystrophy of the left great toenail and right third toenail. Skin is warm, dry and supple bilateral lower extremities. Negative for open lesions or macerations. Hyperkeratotic lesions present on the bilateral great toes. Pain on palpation with a central nucleated core noted.  Vascular: Dorsalis Pedis artery and Posterior Tibial artery pedal pulses palpable. No lower extremity edema noted.   Neruologic: Grossly intact via light touch bilateral.  Musculoskeletal: Muscular strength within normal limits in all groups bilateral. Normal range of motion noted to all pedal and ankle joints.   Assessment:  #1 Dystrophic nail of the left hallux #2 Pre-ulcerative callus lesions bilateral great toes #3 Onychomycosis right third toe  Plan of Care:  1. Patient evaluated.  2. Discussed treatment alternatives and plan of care. Explained nail avulsion procedure and post procedure course to patient. 3. Patient opted for total temporary nail avulsion of the left hallux.  4. Prior to procedure, local anesthesia infiltration utilized using 3 ml of a 50:50 mixture of 2% plain lidocaine and 0.5% plain marcaine in a normal hallux block fashion and a  betadine prep performed.  5. Light dressing applied. 6. Excisional debridement of keratoic lesion using a chisel blade was performed without incident. Light dressing applied.  7. Continue taking oral Lamisil as directed by PCP.  8. Return to clinic as needed.   Felecia Shelling, DPM [facility]  Dr. Felecia Shelling, DPM    [address]                                        [place], [place] [address]                Office [phone]  Fax [phone]

## 2018-03-24 ENCOUNTER — Ambulatory Visit
Admission: RE | Admit: 2018-03-24 | Discharge: 2018-03-24 | Disposition: A | Discharge status: Home / Self Care | Payer: Managed Care, Other (non HMO) | Source: Ambulatory Visit | Attending: Sports Medicine | Admitting: Sports Medicine

## 2018-03-24 DIAGNOSIS — M545 Low back pain, unspecified: Secondary | ICD-10-CM

## 2018-03-29 ENCOUNTER — Telehealth: Payer: Self-pay | Admitting: Family Medicine

## 2018-03-29 NOTE — Telephone Encounter (Signed)
Copied from CRM [id number]. Topic: General - Other >> Mar 29, 2018 12:17 PM Jaquita Rector A wrote: Reason for CRM: Patient called to say that he contacted [facility] and their first available appointment is on 05/17/18 and he would like to be referred someplace else that he can get a quicker visit. Ph # [phone]

## 2020-06-10 IMAGING — MR MR LUMBAR SPINE W/O CM
4 of 5 series · 18 of 48 positions shown · non-contrast
Comparison: None.

CLINICAL DATA: Low back and left leg pain for 6 weeks.

EXAM:
MRI LUMBAR SPINE WITHOUT CONTRAST
TECHNIQUE: Multiplanar, multisequence MR imaging of the lumbar spine was
performed. No intravenous contrast was administered.

[Series 5: T2 · sagittal · 4.0mm · 0.73mm/px · 6 of 15 slices shown (1 of 2)]
[im 1/15]
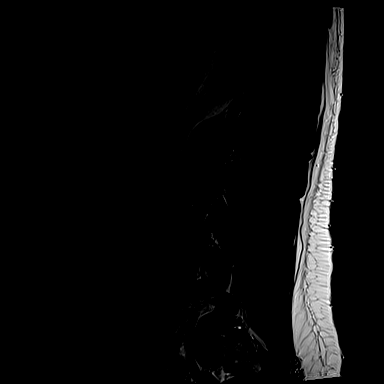
[im 3/15]
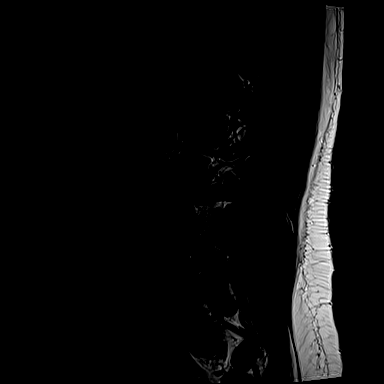
[im 6/15]
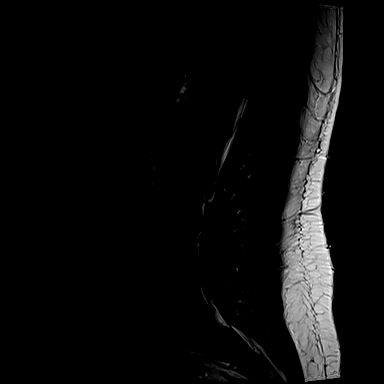
[im 9/15]
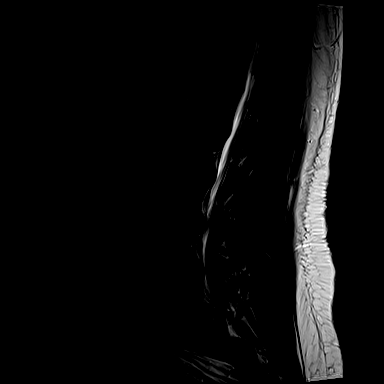
[im 12/15]
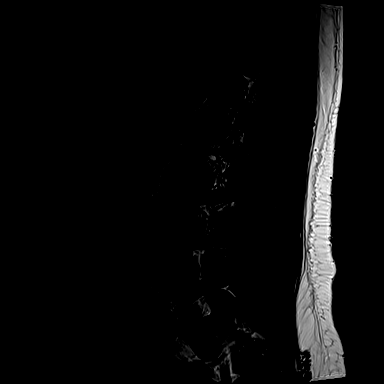
[im 15/15]
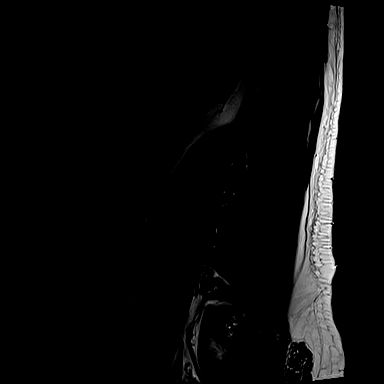

[Series 6: T1 · sagittal · 4.0mm · 0.73mm/px · 3 of 15 slices shown (1 of 2)]
[im 3/15]
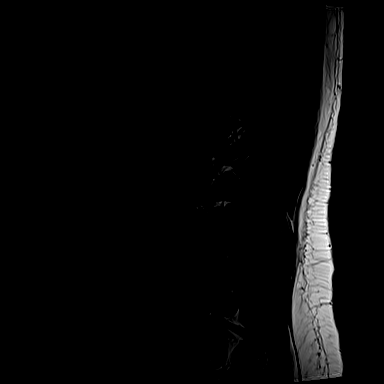
[im 9/15]
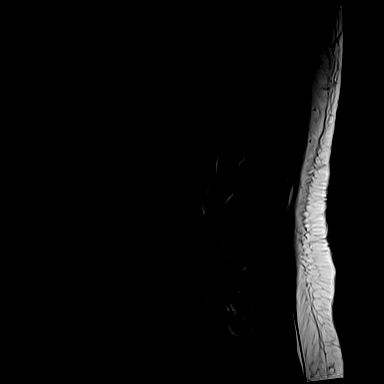
[im 15/15]
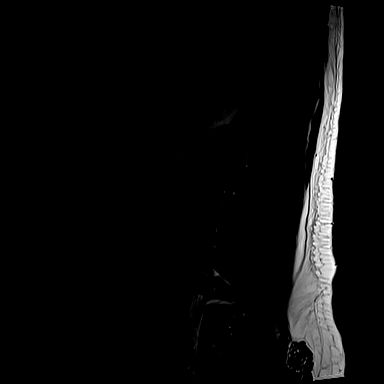

[Series 10: T1 · axial · 4.0mm · 0.28mm/px · z∈[-99,+74]mm · 3 of 41 slices shown (2 of 2)]
[im 6/41]
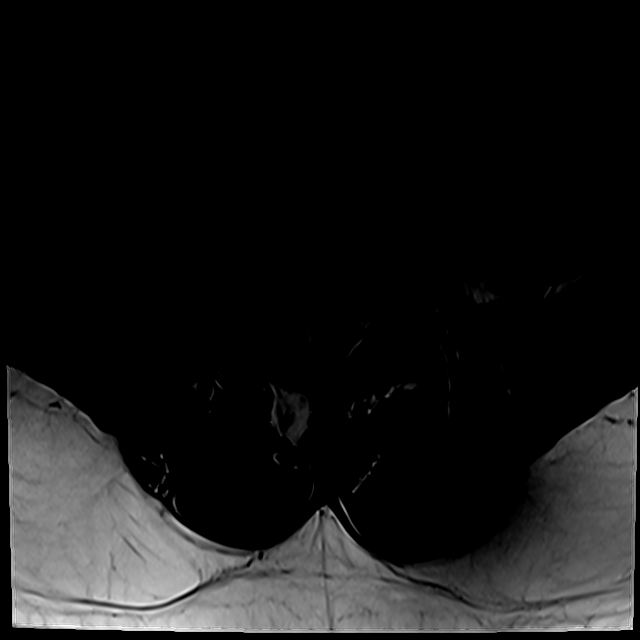
[im 21/41]
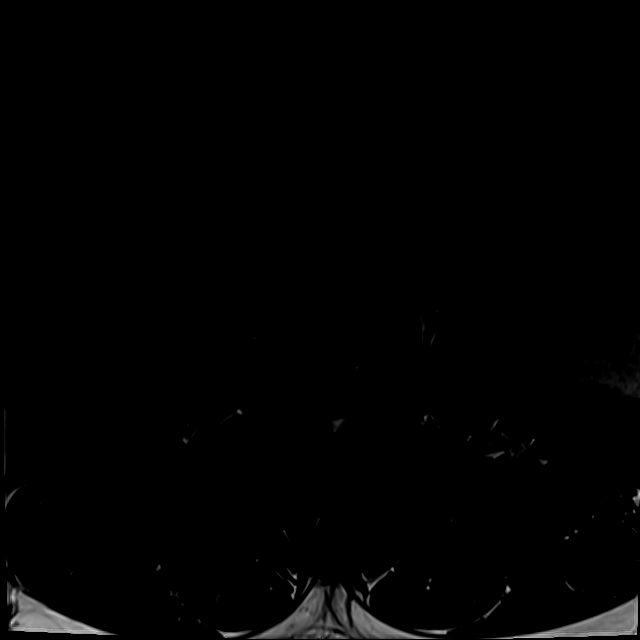
[im 35/41]
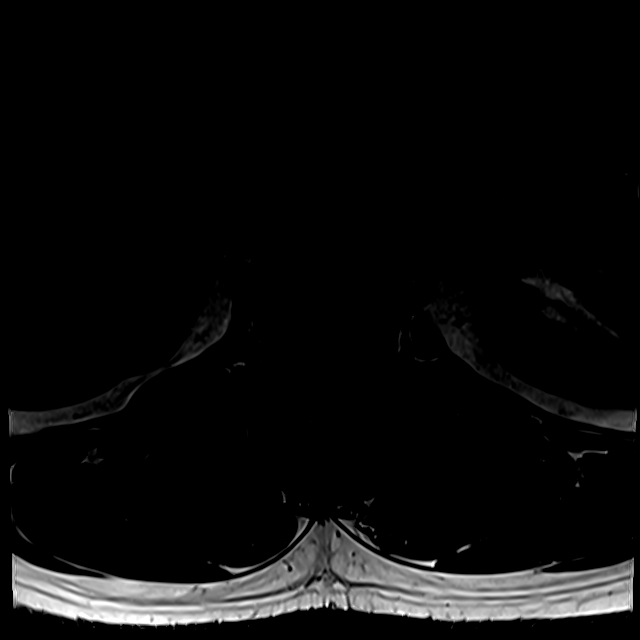

[Series 13: T2 · axial · 4.0mm · 0.28mm/px · z∈[-124,+74]mm · 6 of 41 slices shown (2 of 2)]
[im 1/41]
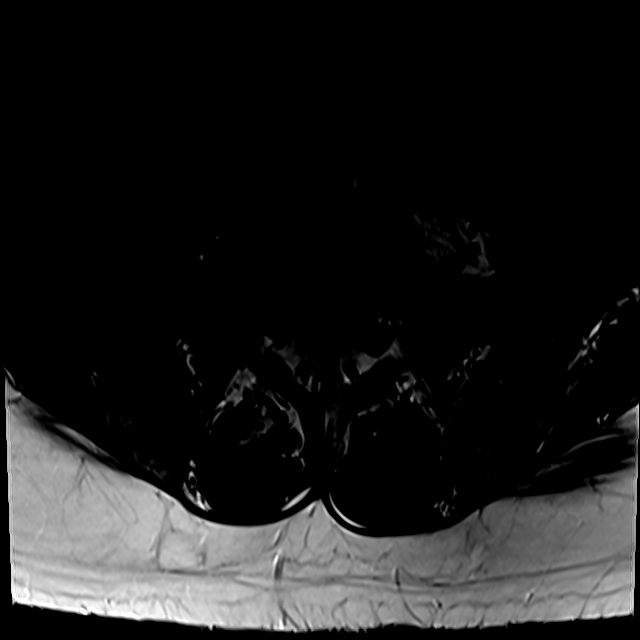
[im 6/41]
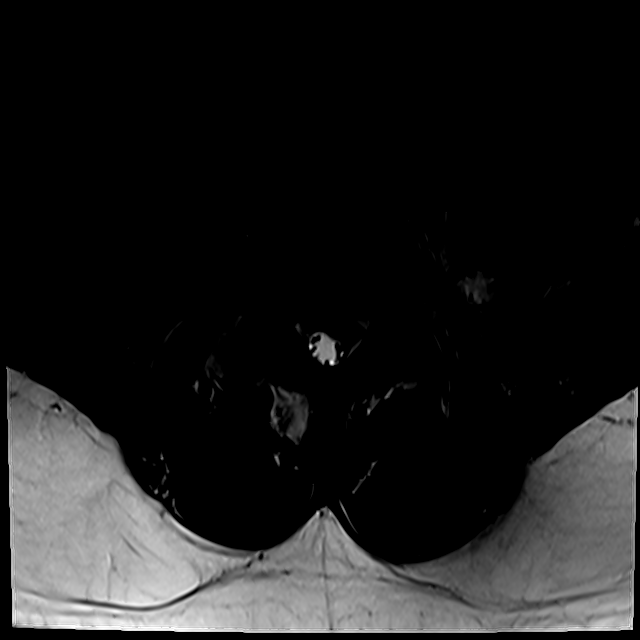
[im 12/41]
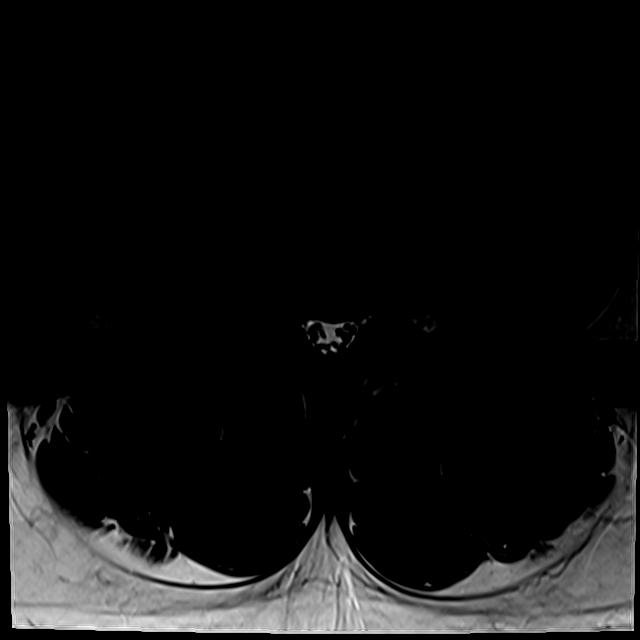
[im 18/41]
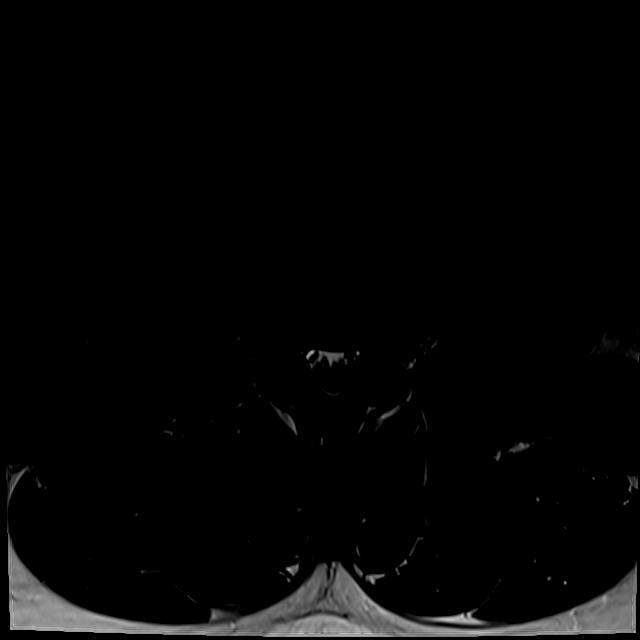
[im 21/41]
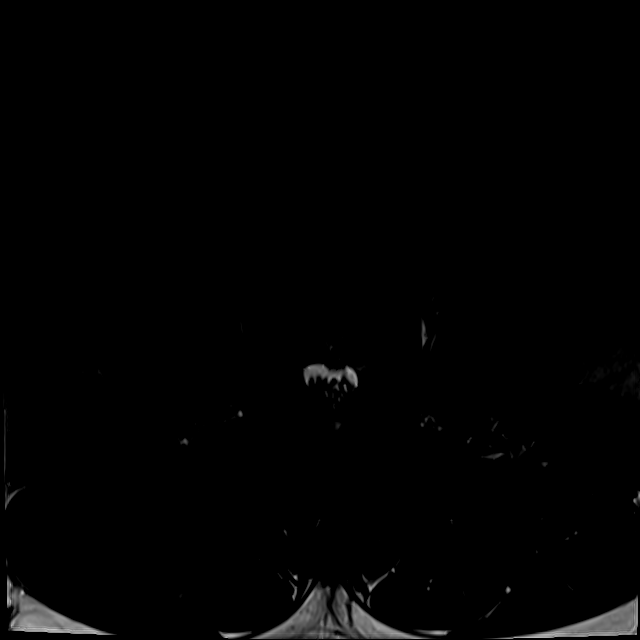
[im 35/41]
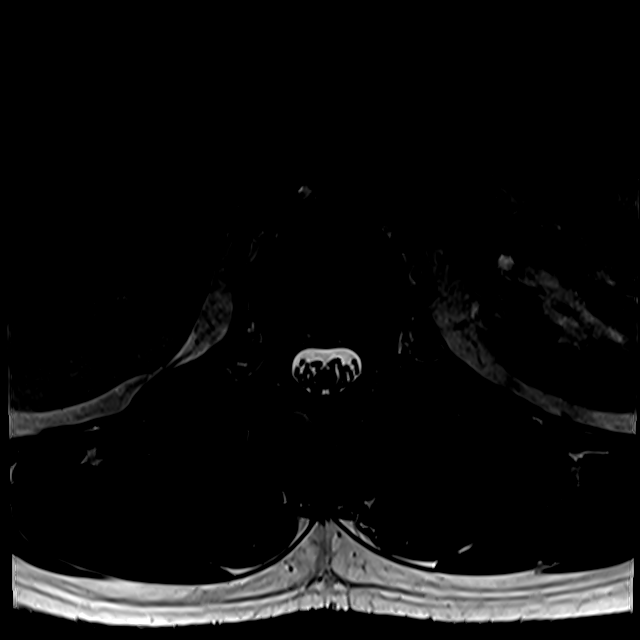

[18 of 48 positions shown; findings below may reference images not displayed]

FINDINGS: Segmentation: There are five lumbar type vertebral bodies. The last
full intervertebral disc space is labeled L5-S1.

Alignment:  Normal

Vertebrae:  Normal marrow signal.  No bone lesions or fractures.

Conus medullaris and cauda equina: Conus extends to the L1 level.
Conus and cauda equina appear normal.

Paraspinal and other soft tissues: No significant findings.

Disc levels:

T12-L1: Right-sided focal disc protrusion with mild mass effect on
the right side of the thecal sac.

L1-2: No significant findings.

L2-3: Mild facet disease. No disc protrusions, spinal or foraminal
stenosis.

L3-4: Mild bulging annulus and moderate facet disease with mild
bilateral lateral recess encroachment. No spinal or foraminal
stenosis.

L4-5: Moderate facet disease and slight bulging annulus but no
significant spinal or foraminal stenosis.

L5-S1: Focal small to moderate-sized left paracentral disc
protrusion with mass effect on the left side of the thecal sac and
on the left S1 nerve root likely accounting for the patient's left
radicular symptoms. The exiting L5 nerve roots appear normal.
Moderate facet disease.
IMPRESSION: 1. Focal right-sided disc protrusion at T12-L1.
2. Focal small to moderate-sized left paracentral disc protrusion at
L5-S1 contacting and likely irritating the left S1 nerve root and
most likely accounting for the patient's left radicular symptoms.

## 2020-08-05 ENCOUNTER — Other Ambulatory Visit: Payer: Self-pay

## 2020-08-05 ENCOUNTER — Emergency Department (INDEPENDENT_AMBULATORY_CARE_PROVIDER_SITE_OTHER)
Admission: RE | Admit: 2020-08-05 | Discharge: 2020-08-05 | Disposition: A | Discharge status: Home / Self Care | Payer: Managed Care, Other (non HMO) | Source: Ambulatory Visit | Attending: Family Medicine | Admitting: Family Medicine

## 2020-08-05 VITALS — BP 127/90 | HR 77 | Temp 98.3°F | Resp 16

## 2020-08-05 DIAGNOSIS — R059 Cough, unspecified: Secondary | ICD-10-CM | POA: Diagnosis not present

## 2020-08-05 MED ORDER — AZITHROMYCIN 250 MG PO TABS: 250.0000 mg | ORAL_TABLET | Freq: Every day | ORAL | 0 refills | Status: DC

## 2020-08-05 MED ORDER — BENZONATATE 200 MG PO CAPS: 200.0000 mg | ORAL_CAPSULE | Freq: Two times a day (BID) | ORAL | 0 refills | Status: DC | PRN

## 2020-08-05 NOTE — ED Provider Notes (Signed)
[facility]    CSN: [id number] Arrival date & time: 08/05/20  1041      History   Chief Complaint Chief Complaint  Patient presents with  . Appointment    1100  . Cough    HPI Kevin Wang is a 50 y.o. male.   HPI   Healthy 50 year old gentleman who states that he is "prone to bronchitis".  Non-smoker.  States he has been coughing for 3 weeks.  Coughing up some sputum.  He states that he does have some postnasal drip.  Past Medical History:  Diagnosis Date  . Allergy   . Anxiety     There are no problems to display for this patient.   History reviewed. No pertinent surgical history.     Home Medications    Prior to Admission medications   Medication Sig Start Date End Date Taking? Authorizing Provider  azithromycin (ZITHROMAX) 250 MG tablet Take 1 tablet (250 mg total) by mouth daily. Take first 2 tablets together, then 1 every day until finished. 08/05/20  Yes Eustace Moore, MD  benzonatate (TESSALON) 200 MG capsule Take 1 capsule (200 mg total) by mouth 2 (two) times daily as needed for cough. 08/05/20  Yes Eustace Moore, MD  ALPRAZolam Prudy Feeler) 0.25 MG tablet Take 1 tablet (0.25 mg total) by mouth 2 (two) times daily as needed. for anxiety 05/08/17   [name], MD  sildenafil (VIAGRA) 100 MG tablet Take 0.5-1 tablets (50-100 mg total) by mouth daily as needed for erectile dysfunction. 02/23/18   [name], MD    Family History Family History  Problem Relation Age of Onset  . Asthma Mother   . Cancer Mother        colon  . Cancer Father     Social History Social History   Tobacco Use  . Smoking status: Never Smoker  . Smokeless tobacco: Never Used  Substance Use Topics  . Alcohol use: Yes    Alcohol/week: 1.0 standard drink    Types: 1 Cans of beer per week    Comment: weekly  . Drug use: No     Allergies   Patient has no known allergies.   Review of Systems Review of Systems See HPI  Physical  Exam Triage Vital Signs ED Triage Vitals  Enc Vitals Group     BP 08/05/20 1103 (!) 153/95     Pulse Rate 08/05/20 1103 77     Resp 08/05/20 1103 16     Temp 08/05/20 1103 98.3 F (36.8 C)     Temp Source 08/05/20 1103 Oral     SpO2 08/05/20 1103 96 %     Weight --      Height --      Head Circumference --      Peak Flow --      Pain Score 08/05/20 1100 0     Pain Loc --      Pain Edu? --      Excl. in GC? --    No data found.  Updated Vital Signs BP 127/90 (BP Location: Left Arm)   Pulse 77   Temp 98.3 F (36.8 C) (Oral)   Resp 16   SpO2 96%       Physical Exam Constitutional:      General: He is not in acute distress.    Appearance: Normal appearance. He is well-developed.     Comments: Heavyset  HENT:     Head: Normocephalic and  atraumatic.     Right Ear: There is impacted cerumen.     Left Ear: There is impacted cerumen.     Nose: Congestion present.     Mouth/Throat:     Mouth: Mucous membranes are moist.     Pharynx: No posterior oropharyngeal erythema.  Eyes:     Conjunctiva/sclera: Conjunctivae normal.     Pupils: Pupils are equal, round, and reactive to light.  Cardiovascular:     Rate and Rhythm: Normal rate and regular rhythm.     Heart sounds: Normal heart sounds.  Pulmonary:     Effort: Pulmonary effort is normal. No respiratory distress.     Breath sounds: Normal breath sounds.     Comments: Lungs are clear Abdominal:     Palpations: Abdomen is soft.  Musculoskeletal:        General: Normal range of motion.     Cervical back: Normal range of motion.  Lymphadenopathy:     Cervical: No cervical adenopathy.  Skin:    General: Skin is warm and dry.  Neurological:     Mental Status: He is alert.  Psychiatric:        Behavior: Behavior normal.      UC Treatments / Results  Labs (all labs ordered are listed, but only abnormal results are displayed) Labs Reviewed - No data to display  EKG   Radiology No results  found.  Procedures Procedures (including critical care time)  Medications Ordered in UC Medications - No data to display  Initial Impression / Assessment and Plan / UC Course  I have reviewed the triage vital signs and the nursing notes.  Pertinent labs & imaging results that were available during my care of the patient were reviewed by me and considered in my medical decision making (see chart for details).     Patient has persistent cough.  Likely due to a viral bronchitis, possible Covid.  Will cover with antibiotics since he has been coughing for 3 weeks.  He requests cough medicine with codeine but is encouraged to try Tessalon, may add Mucinex DM if needed Final Clinical Impressions(s) / UC Diagnoses   Final diagnoses:  Cough     Discharge Instructions     Drink plenty of fluids Take antibiotic as directed Take Tessalon 2-3 times a day See your doctor if not improving by next week   ED Prescriptions    Medication Sig Dispense Auth. Provider   benzonatate (TESSALON) 200 MG capsule Take 1 capsule (200 mg total) by mouth 2 (two) times daily as needed for cough. 20 capsule Eustace Moore, MD   azithromycin (ZITHROMAX) 250 MG tablet Take 1 tablet (250 mg total) by mouth daily. Take first 2 tablets together, then 1 every day until finished. 6 tablet Eustace Moore, MD     PDMP not reviewed this encounter.   Eustace Moore, MD 08/05/20 [phone]

## 2020-08-05 NOTE — ED Triage Notes (Signed)
Patient presents to Urgent Care with complaints of cough since about 2-3 weeks ago. Patient reports he thinks he had covid 2-3 weeks ago but never got tested. Now has residual cough and thinks it has tuned into bronchitis.

## 2020-08-05 NOTE — Discharge Instructions (Addendum)
Drink plenty of fluids Take antibiotic as directed Take Tessalon 2-3 times a day See your doctor if not improving by next week

## 2020-08-07 ENCOUNTER — Telehealth: Payer: Self-pay | Admitting: Family Medicine

## 2020-08-07 ENCOUNTER — Telehealth: Payer: Self-pay | Admitting: Emergency Medicine

## 2020-08-07 MED ORDER — GUAIFENESIN-CODEINE 100-10 MG/5ML PO SOLN: ORAL | 0 refills | Status: DC

## 2020-08-07 NOTE — Telephone Encounter (Signed)
Patient called he is having a difficult time sleeping due to the cough. Asked if he could have a strong cough syrup.

## 2020-08-07 NOTE — Telephone Encounter (Signed)
For [place] to prescribe new Rx he would have to have a virtual with [place].

## 2020-08-07 NOTE — Telephone Encounter (Signed)
Patient seen in urgent care on 3/14; pt waswas prescribed cough medication and antibiotics. Patient asking for a stronger cough medication with codeine to help him sleep at night - medication prescribed isn't giving him much relief. Coughing hurts his throat and he coughs all night. Patient coughing for 3 weeks..cough has gotten worse since Friday 3/11.   Preferred pharmacy is  [id number] - [place], [place] - [address]  [address] [address]  Phone:  [phone] Fax:  [phone]  DEA #:  [id number]  Please advise at [phone]

## 2020-08-07 NOTE — Telephone Encounter (Signed)
Left message on patient's voicemail- asked for call back to schedule virtual appt.

## 2020-08-07 NOTE — Telephone Encounter (Signed)
Rx given for Robitussin AC:  73mL PO HS PRN cough.  #35mL, no refill.

## 2020-09-19 ENCOUNTER — Other Ambulatory Visit: Payer: Self-pay

## 2020-09-19 ENCOUNTER — Ambulatory Visit (INDEPENDENT_AMBULATORY_CARE_PROVIDER_SITE_OTHER): Payer: Managed Care, Other (non HMO) | Admitting: Registered Nurse

## 2020-09-19 ENCOUNTER — Encounter: Payer: Self-pay | Admitting: Registered Nurse

## 2020-09-19 VITALS — BP 141/83 | HR 60 | Temp 98.2°F | Resp 18 | Ht 69.0 in | Wt 245.2 lb

## 2020-09-19 DIAGNOSIS — Z13228 Encounter for screening for other metabolic disorders: Secondary | ICD-10-CM

## 2020-09-19 DIAGNOSIS — Z1329 Encounter for screening for other suspected endocrine disorder: Secondary | ICD-10-CM | POA: Diagnosis not present

## 2020-09-19 DIAGNOSIS — Z1211 Encounter for screening for malignant neoplasm of colon: Secondary | ICD-10-CM

## 2020-09-19 DIAGNOSIS — Z130 Encounter for screening for diseases of the blood and blood-forming organs and certain disorders involving the immune mechanism: Secondary | ICD-10-CM | POA: Diagnosis not present

## 2020-09-19 DIAGNOSIS — Z125 Encounter for screening for malignant neoplasm of prostate: Secondary | ICD-10-CM | POA: Diagnosis not present

## 2020-09-19 DIAGNOSIS — Z0000 Encounter for general adult medical examination without abnormal findings: Secondary | ICD-10-CM | POA: Diagnosis not present

## 2020-09-19 DIAGNOSIS — Z13220 Encounter for screening for lipoid disorders: Secondary | ICD-10-CM

## 2020-09-19 DIAGNOSIS — F418 Other specified anxiety disorders: Secondary | ICD-10-CM | POA: Diagnosis not present

## 2020-09-19 LAB — CBC WITH DIFFERENTIAL/PLATELET
Basophils Absolute: 0.1 10*3/uL (ref 0.0–0.1)
Basophils Relative: 1.1 % (ref 0.0–3.0)
Eosinophils Absolute: 0.1 10*3/uL (ref 0.0–0.7)
Eosinophils Relative: 1.6 % (ref 0.0–5.0)
HCT: 42.5 % (ref 39.0–52.0)
Hemoglobin: 14.5 g/dL (ref 13.0–17.0)
Lymphocytes Relative: 39.5 % (ref 12.0–46.0)
Lymphs Abs: 2.5 10*3/uL (ref 0.7–4.0)
MCHC: 34.1 g/dL (ref 30.0–36.0)
MCV: 91.1 fl (ref 78.0–100.0)
Monocytes Absolute: 0.6 10*3/uL (ref 0.1–1.0)
Monocytes Relative: 8.8 % (ref 3.0–12.0)
Neutro Abs: 3.1 10*3/uL (ref 1.4–7.7)
Neutrophils Relative %: 49 % (ref 43.0–77.0)
Platelets: 283 10*3/uL (ref 150.0–400.0)
RBC: 4.67 Mil/uL (ref 4.22–5.81)
RDW: 13.2 % (ref 11.5–15.5)
WBC: 6.4 10*3/uL (ref 4.0–10.5)

## 2020-09-19 LAB — COMPREHENSIVE METABOLIC PANEL
ALT: 16 U/L (ref 0–53)
AST: 18 U/L (ref 0–37)
Albumin: 4.3 g/dL (ref 3.5–5.2)
Alkaline Phosphatase: 59 U/L (ref 39–117)
BUN: 22 mg/dL (ref 6–23)
CO2: 28 mEq/L (ref 19–32)
Calcium: 9.8 mg/dL (ref 8.4–10.5)
Chloride: 101 mEq/L (ref 96–112)
Creatinine, Ser: 1.2 mg/dL (ref 0.40–1.50)
GFR: 70.68 mL/min (ref >60.00)
Glucose, Bld: 89 mg/dL (ref 70–99)
Potassium: 4.3 mEq/L (ref 3.5–5.1)
Sodium: 137 mEq/L (ref 135–145)
Total Bilirubin: 0.4 mg/dL (ref 0.2–1.2)
Total Protein: 7.1 g/dL (ref 6.0–8.3)

## 2020-09-19 LAB — TSH: TSH: 4.3 u[IU]/mL (ref 0.35–4.50)

## 2020-09-19 LAB — LIPID PANEL
Cholesterol: 162 mg/dL (ref 0–200)
HDL: 59.1 mg/dL (ref >39.00)
LDL Cholesterol: 92 mg/dL (ref 0–99)
NonHDL: 103.21
Total CHOL/HDL Ratio: 3
Triglycerides: 54 mg/dL (ref 0.0–149.0)
VLDL: 10.8 mg/dL (ref 0.0–40.0)

## 2020-09-19 LAB — HEMOGLOBIN A1C: Hgb A1c MFr Bld: 5.4 % (ref 4.6–6.5)

## 2020-09-19 LAB — PSA: PSA: 0.79 ng/mL (ref 0.10–4.00)

## 2020-09-19 MED ORDER — ALPRAZOLAM 0.25 MG PO TABS: 0.2500 mg | ORAL_TABLET | Freq: Two times a day (BID) | ORAL | 0 refills | Status: AC | PRN

## 2020-09-19 NOTE — Patient Instructions (Addendum)
  Kevin Wang -  Randie Heinz to meet you. Your exam was reassuring, no concerns.   Cologuard will be sent to your home. Please send this back soon after you receive it and heed all given instructions.  PSA (prostate), CBC (blood counts), CMP (liver and kidney function), TSH (thyroid), A1c (sugars), and Lipid Panel (cholesterol) will be collected today. Results should be in within 24 h.  Follow up in a year for next physical, sooner if other needs arise  Thanks,  Rich   If you have lab work done today you will be contacted with your lab results within the next 2 weeks.  If you have not heard from [place] then please contact us. The fastest way to get your results is to register for My Chart.   IF you received an x-ray today, you will receive an invoice from [facility]. Please contact [facility] at [phone] with questions or concerns regarding your invoice.   IF you received labwork today, you will receive an invoice from [facility]. Please contact [facility] at [phone] with questions or concerns regarding your invoice.   Our billing staff will not be able to assist you with questions regarding bills from these companies.  You will be contacted with the lab results as soon as they are available. The fastest way to get your results is to activate your My Chart account. Instructions are located on the last page of this paperwork. If you have not heard from [place] regarding the results in 2 weeks, please contact this office.

## 2020-09-19 NOTE — Progress Notes (Signed)
Established Patient Office Visit  Subjective:  Patient ID: Kevin Wang, male    DOB: 10/20/1970  Age: 50 y.o. MRN: [id number]  CC:  Chief Complaint  Patient presents with  . Annual Exam    Patient is here for a CPE.patient also need medication refill on the pended medication.    HPI Kevin Wang presents for CPE  No acute concerns  Situational anxiety: has been on alprazolam 0.25mg  Po qd in the past as needed for stress with meetings. Uses this 2-3 times each year. No AEs.   Colon CA screening: due. fam hx in mother, dx at age 31, daily exposure to secondhand smoke. No recurrence in past 10 years after initial treatment. Father did have lung CA but was heavy smoker.     Past Medical History:  Diagnosis Date  . Allergy   . Anxiety     History reviewed. No pertinent surgical history.  Family History  Problem Relation Age of Onset  . Asthma Mother   . Cancer Mother        colon  . Heart failure Mother   . Lung cancer Father        smoker    Social History   Socioeconomic History  . Marital status: Married    Spouse name: Not on file  . Number of children: Not on file  . Years of education: Not on file  . Highest education level: Not on file  Occupational History  . Occupation: Electronics engineer: [employer]  Tobacco Use  . Smoking status: Never Smoker  . Smokeless tobacco: Never Used  Substance and Sexual Activity  . Alcohol use: Yes    Alcohol/week: 1.0 standard drink    Types: 1 Cans of beer per week    Comment: weekly  . Drug use: No  . Sexual activity: Yes    Comment: number of sex partners in the last 12 months 1  Other Topics Concern  . Not on file  Social History Narrative   Exercise cardio and   Social Determinants of Health   Financial Resource Strain: Not on file  Food Insecurity: Not on file  Transportation Needs: Not on file  Physical Activity: Not on file  Stress: Not on file  Social Connections: Not on file  Intimate  Partner Violence: Not on file    Outpatient Medications Prior to Visit  Medication Sig Dispense Refill  . ALPRAZolam (XANAX) 0.25 MG tablet Take 1 tablet (0.25 mg total) by mouth 2 (two) times daily as needed. for anxiety 20 tablet 0  . sildenafil (VIAGRA) 100 MG tablet Take 0.5-1 tablets (50-100 mg total) by mouth daily as needed for erectile dysfunction. (Patient not taking: Reported on 09/19/2020) 5 tablet 3  . azithromycin (ZITHROMAX) 250 MG tablet Take 1 tablet (250 mg total) by mouth daily. Take first 2 tablets together, then 1 every day until finished. (Patient not taking: Reported on 09/19/2020) 6 tablet 0  . benzonatate (TESSALON) 200 MG capsule Take 1 capsule (200 mg total) by mouth 2 (two) times daily as needed for cough. (Patient not taking: Reported on 09/19/2020) 20 capsule 0  . guaiFENesin-codeine 100-10 MG/5ML syrup Take 65mL by mouth at bedtime as needed for cough. (Patient not taking: Reported on 09/19/2020) 50 mL 0   No facility-administered medications prior to visit.    No Known Allergies  ROS Review of Systems  Constitutional: Negative.   HENT: Negative.   Eyes: Negative.   Respiratory: Negative.  Cardiovascular: Negative.   Gastrointestinal: Negative.   Genitourinary: Negative.   Musculoskeletal: Negative.   Skin: Negative.   Neurological: Negative.   Psychiatric/Behavioral: Negative.   All other systems reviewed and are negative.     Objective:    Physical Exam Vitals and nursing note reviewed.  Constitutional:      General: He is not in acute distress.    Appearance: Normal appearance. He is normal weight. He is not ill-appearing, toxic-appearing or diaphoretic.  HENT:     Head: Normocephalic and atraumatic.     Right Ear: Tympanic membrane, ear canal and external ear normal. There is no impacted cerumen.     Left Ear: Tympanic membrane, ear canal and external ear normal. There is no impacted cerumen.     Nose: Nose normal. No congestion or  rhinorrhea.     Mouth/Throat:     Mouth: Mucous membranes are moist.     Pharynx: Oropharynx is clear. No oropharyngeal exudate or posterior oropharyngeal erythema.  Eyes:     General: No scleral icterus.       Right eye: No discharge.        Left eye: No discharge.     Extraocular Movements: Extraocular movements intact.     Conjunctiva/sclera: Conjunctivae normal.     Pupils: Pupils are equal, round, and reactive to light.  Neck:     Vascular: No carotid bruit.  Cardiovascular:     Rate and Rhythm: Normal rate and regular rhythm.     Pulses: Normal pulses.     Heart sounds: Normal heart sounds. No murmur heard. No friction rub. No gallop.   Pulmonary:     Effort: Pulmonary effort is normal. No respiratory distress.     Breath sounds: Normal breath sounds. No stridor. No wheezing, rhonchi or rales.  Chest:     Chest wall: No tenderness.  Abdominal:     General: Abdomen is flat. Bowel sounds are normal. There is no distension.     Palpations: Abdomen is soft. There is no mass.     Tenderness: There is no abdominal tenderness. There is no right CVA tenderness, left CVA tenderness, guarding or rebound.     Hernia: No hernia is present.  Musculoskeletal:        General: No swelling, tenderness, deformity or signs of injury. Normal range of motion.     Cervical back: Normal range of motion and neck supple. No rigidity or tenderness.     Right lower leg: No edema.     Left lower leg: No edema.  Lymphadenopathy:     Cervical: No cervical adenopathy.  Skin:    General: Skin is warm and dry.     Capillary Refill: Capillary refill takes less than 2 seconds.     Coloration: Skin is not jaundiced or pale.     Findings: No bruising, erythema, lesion or rash.  Neurological:     General: No focal deficit present.     Mental Status: He is alert and oriented to person, place, and time. Mental status is at baseline.     Cranial Nerves: No cranial nerve deficit.     Motor: No weakness.      Gait: Gait normal.  Psychiatric:        Mood and Affect: Mood normal.        Behavior: Behavior normal.        Thought Content: Thought content normal.        Judgment: Judgment normal.  BP (!) 141/83   Pulse 60   Temp 98.2 F (36.8 C) (Temporal)   Resp 18   Ht 5\' 9"  (1.753 m)   Wt 245 lb 3.2 oz (111.2 kg)   SpO2 99%   BMI 36.21 kg/m  Wt Readings from Last 3 Encounters:  09/19/20 245 lb 3.2 oz (111.2 kg)  02/23/18 221 lb (100.2 kg)  01/11/18 225 lb 9.6 oz (102.3 kg)     Health Maintenance Due  Topic Date Due  . COLONOSCOPY (Pts 45-47yrs Insurance coverage will need to be confirmed)  Never done  . COVID-19 Vaccine (2 - Booster for Janssen series) 06/08/2020    There are no preventive care reminders to display for this patient.  Lab Results  Component Value Date   TSH 2.220 11/27/2012   Lab Results  Component Value Date   WBC 6.8 11/27/2012   HGB 14.2 11/27/2012   HCT 43.8 11/27/2012   MCV 95.6 11/27/2012   Lab Results  Component Value Date   NA 143 01/11/2018   K 4.4 01/11/2018   CO2 25 01/11/2018   GLUCOSE 92 01/11/2018   BUN 17 01/11/2018   CREATININE 1.05 01/11/2018   BILITOT 0.3 02/23/2018   ALKPHOS 60 02/23/2018   AST 22 02/23/2018   ALT 20 02/23/2018   PROT 6.5 02/23/2018   ALBUMIN 4.1 02/23/2018   CALCIUM 9.6 01/11/2018   Lab Results  Component Value Date   CHOL 153 05/08/2017   Lab Results  Component Value Date   HDL 70 05/08/2017   Lab Results  Component Value Date   LDLCALC 71 05/08/2017   Lab Results  Component Value Date   TRIG 62 05/08/2017   Lab Results  Component Value Date   CHOLHDL 2.2 05/08/2017   No results found for: HGBA1C    Assessment & Plan:   Problem List Items Addressed This Visit      Other   Situational anxiety   Relevant Medications   ALPRAZolam (XANAX) 0.25 MG tablet    Other Visit Diagnoses    Annual physical exam    -  Primary   Screening for endocrine, metabolic and immunity disorder        Relevant Orders   CBC with Differential/Platelet   Comprehensive metabolic panel   TSH   Hemoglobin A1c   Lipid screening       Relevant Orders   Lipid panel   Screening PSA (prostate specific antigen)       Relevant Orders   PSA   Colon cancer screening       Relevant Orders   Cologuard      Meds ordered this encounter  Medications  . ALPRAZolam (XANAX) 0.25 MG tablet    Sig: Take 1 tablet (0.25 mg total) by mouth 2 (two) times daily as needed. for anxiety    Dispense:  20 tablet    Refill:  0    Follow-up: No follow-ups on file.   PLAN  Exam reassuring. No concerns  Refill alprazolam  Opt for cologuard for colon ca screen. Discussed risk and benefit of this vs. Colonoscopy vs FOBT  Psa screening - risk and benefit discussed. Will collect today  Labs collected. Will follow up with the patient as warranted.  Patient encouraged to call clinic with any questions, comments, or concerns.  05/10/2017, NP

## 2020-12-12 ENCOUNTER — Encounter: Payer: Self-pay | Admitting: Registered Nurse

## 2020-12-13 NOTE — Telephone Encounter (Signed)
Please see last visit - seen by Jari Sportsman, NP.  Please advise pt that Cologuard will be sent to his home.  That information should also be on his after visit summary. If he would like shingles vaccine, okay to schedule nurse visit for Shingrix.  Let me know if there are questions.

## 2022-01-23 ENCOUNTER — Other Ambulatory Visit: Payer: Self-pay | Admitting: Family Medicine

## 2022-01-23 DIAGNOSIS — R519 Headache, unspecified: Secondary | ICD-10-CM

## 2022-02-10 ENCOUNTER — Ambulatory Visit
Admission: RE | Admit: 2022-02-10 | Discharge: 2022-02-10 | Disposition: A | Discharge status: Home / Self Care | Payer: Managed Care, Other (non HMO) | Source: Ambulatory Visit | Attending: Family Medicine | Admitting: Family Medicine

## 2022-02-10 DIAGNOSIS — R519 Headache, unspecified: Secondary | ICD-10-CM

## 2022-02-10 MED ORDER — GADOBENATE DIMEGLUMINE 529 MG/ML IV SOLN
20.0000 mL | Freq: Once | INTRAVENOUS | Status: AC | PRN
  Administered 2022-02-10: 20 mL via INTRAVENOUS

## 2022-02-21 ENCOUNTER — Telehealth: Payer: Managed Care, Other (non HMO) | Admitting: Physician Assistant

## 2022-02-21 DIAGNOSIS — J019 Acute sinusitis, unspecified: Secondary | ICD-10-CM | POA: Diagnosis not present

## 2022-02-21 DIAGNOSIS — B9689 Other specified bacterial agents as the cause of diseases classified elsewhere: Secondary | ICD-10-CM | POA: Diagnosis not present

## 2022-02-21 MED ORDER — FLUTICASONE PROPIONATE 50 MCG/ACT NA SUSP: 2.0000 | Freq: Every day | NASAL | 0 refills | Status: DC

## 2022-02-21 MED ORDER — AMOXICILLIN-POT CLAVULANATE 875-125 MG PO TABS: 1.0000 | ORAL_TABLET | Freq: Two times a day (BID) | ORAL | 0 refills | Status: DC

## 2022-02-21 NOTE — Progress Notes (Signed)
E-Visit for Sinus Problems  We are sorry that you are not feeling well.  Here is how we plan to help!  Based on what you have shared with me it looks like you have sinusitis.  Sinusitis is inflammation and infection in the sinus cavities of the head.  Based on your presentation I believe you most likely have Acute Bacterial Sinusitis.  This is an infection caused by bacteria and is treated with antibiotics. I have prescribed Augmentin 875mg /125mg  one tablet twice daily with food, for 7 days. You may use an oral decongestant such as Mucinex D or if you have glaucoma or high blood pressure use plain Mucinex. Saline nasal spray help and can safely be used as often as needed for congestion.  Will add fluticasone (floanse) nasal spray for the inflammation noted on the MRI and should be continued for chronic sinusitis. If you develop worsening sinus pain, fever or notice severe headache and vision changes, or if symptoms are not better after completion of antibiotic, please schedule an appointment with a health care provider.    Sinus infections are not as easily transmitted as other respiratory infection, however we still recommend that you avoid close contact with loved ones, especially the very young and elderly.  Remember to wash your hands thoroughly throughout the day as this is the number one way to prevent the spread of infection!  Home Care: Only take medications as instructed by your medical team. Complete the entire course of an antibiotic. Do not take these medications with alcohol. A steam or ultrasonic humidifier can help congestion.  You can place a towel over your head and breathe in the steam from hot water coming from a faucet. Avoid close contacts especially the very young and the elderly. Cover your mouth when you cough or sneeze. Always remember to wash your hands.  Get Help Right Away If: You develop worsening fever or sinus pain. You develop a severe head ache or visual  changes. Your symptoms persist after you have completed your treatment plan.  Make sure you Understand these instructions. Will watch your condition. Will get help right away if you are not doing well or get worse.  Thank you for choosing an e-visit.  Your e-visit answers were reviewed by a board certified advanced clinical practitioner to complete your personal care plan. Depending upon the condition, your plan could have included both over the counter or prescription medications.  Please review your pharmacy choice. Make sure the pharmacy is open so you can pick up prescription now. If there is a problem, you may contact your provider through [employer] and have the prescription routed to another pharmacy.  Your safety is important to [place]. If you have drug allergies check your prescription carefully.   For the next 24 hours you can use MyChart to ask questions about today's visit, request a non-urgent call back, or ask for a work or school excuse. You will get an email in the next two days asking about your experience. I hope that your e-visit has been valuable and will speed your recovery.  I provided 5 minutes of non face-to-face time during this encounter for chart review and documentation.

## 2023-05-18 ENCOUNTER — Ambulatory Visit
Admission: EM | Admit: 2023-05-18 | Discharge: 2023-05-18 | Disposition: A | Discharge status: Home / Self Care | Payer: Managed Care, Other (non HMO) | Attending: Family Medicine | Admitting: Family Medicine

## 2023-05-18 DIAGNOSIS — R21 Rash and other nonspecific skin eruption: Secondary | ICD-10-CM | POA: Diagnosis not present

## 2023-05-18 MED ORDER — PREDNISONE 10 MG (21) PO TBPK: ORAL_TABLET | Freq: Every day | ORAL | 0 refills | Status: DC

## 2023-05-18 MED ORDER — METHYLPREDNISOLONE SODIUM SUCC 125 MG IJ SOLR
125.0000 mg | Freq: Once | INTRAMUSCULAR | Status: AC
  Administered 2023-05-18: 125 mg via INTRAMUSCULAR

## 2023-05-18 NOTE — Discharge Instructions (Addendum)
 Advised patient to take medication as directed with food to completion.  Encouraged to increase daily water intake to 64 ounces per day while taking this medication.  Advised if symptoms worsen and/or unresolved please follow-up with PCP or here for further evaluation.

## 2023-05-18 NOTE — ED Triage Notes (Signed)
Pt c/o rash that started on both legs. Lotrimin prn. Very itchy. Starting to spread to abd and back.

## 2023-05-18 NOTE — ED Provider Notes (Signed)
[facility]    CSN: [id number] Arrival date & time: 05/18/23  1744      History   Chief Complaint Chief Complaint  Patient presents with   Rash    HPI Kevin Wang is a 52 y.o. male.   HPI 52 year old male presents with a rash of both legs now starting to spread to abdomen and back. PMH significant for obesity and situational anxiety.  Past Medical History:  Diagnosis Date   Allergy    Anxiety     Patient Active Problem List   Diagnosis Date Noted   Situational anxiety 09/19/2020    History reviewed. No pertinent surgical history.     Home Medications    Prior to Admission medications   Medication Sig Start Date End Date Taking? Authorizing Provider  predniSONE (STERAPRED UNI-PAK 21 TAB) 10 MG (21) TBPK tablet Take by mouth daily. Take 6 tabs by mouth daily  for 2 days, then 5 tabs for 2 days, then 4 tabs for 2 days, then 3 tabs for 2 days, 2 tabs for 2 days, then 1 tab by mouth daily for 2 days 05/18/23  Yes Trevor Iha, FNP  ALPRAZolam Prudy Feeler) 0.25 MG tablet Take 1 tablet (0.25 mg total) by mouth 2 (two) times daily as needed. for anxiety 09/19/20   [name], NP  fluticasone [facility]) 50 MCG/ACT nasal spray Place 2 sprays into both nostrils daily. 02/21/22   Margaretann Loveless, PA-C    Family History Family History  Problem Relation Age of Onset   Asthma Mother    Cancer Mother        colon   Heart failure Mother    Lung cancer Father        smoker    Social History Social History   Tobacco Use   Smoking status: Never   Smokeless tobacco: Never  Substance Use Topics   Alcohol use: Yes    Alcohol/week: 1.0 standard drink of alcohol    Types: 1 Cans of beer per week    Comment: weekly   Drug use: No     Allergies   Patient has no known allergies.   Review of Systems Review of Systems   Physical Exam Triage Vital Signs ED Triage Vitals  Encounter Vitals Group     BP      Systolic BP Percentile      Diastolic  BP Percentile      Pulse      Resp      Temp      Temp src      SpO2      Weight      Height      Head Circumference      Peak Flow      Pain Score      Pain Loc      Pain Education      Exclude from Growth Chart    No data found.  Updated Vital Signs BP (!) 172/101 (BP Location: Right Arm)   Pulse 67   Temp 97.9 F (36.6 C) (Oral)   Resp 17   SpO2 97%   Physical Exam Vitals and nursing note reviewed.  Constitutional:      Appearance: Normal appearance. He is obese. He is not ill-appearing.  HENT:     Head: Normocephalic and atraumatic.     Mouth/Throat:     Mouth: Mucous membranes are moist.     Pharynx: Oropharynx is clear.  Eyes:  Extraocular Movements: Extraocular movements intact.     Conjunctiva/sclera: Conjunctivae normal.     Pupils: Pupils are equal, round, and reactive to light.  Cardiovascular:     Rate and Rhythm: Normal rate and regular rhythm.     Pulses: Normal pulses.     Heart sounds: Normal heart sounds.  Pulmonary:     Effort: Pulmonary effort is normal.     Breath sounds: Normal breath sounds. No wheezing, rhonchi or rales.  Musculoskeletal:        General: Normal range of motion.     Cervical back: Normal range of motion and neck supple.     Comments: Torso (left-sided), buttocks/gluteal cleft: Pruritic erythematous maculopapular eruption noted-please see images below  Skin:    General: Skin is warm and dry.  Neurological:     General: No focal deficit present.     Mental Status: He is alert and oriented to person, place, and time.  Psychiatric:        Mood and Affect: Mood normal.        Behavior: Behavior normal.         UC Treatments / Results  Labs (all labs ordered are listed, but only abnormal results are displayed) Labs Reviewed - No data to display  EKG   Radiology No results found.  Procedures Procedures (including critical care time)  Medications Ordered in UC Medications  methylPREDNISolone sodium  succinate (SOLU-MEDROL) 125 mg/2 mL injection 125 mg (125 mg Intramuscular Given 05/18/23 1828)    Initial Impression / Assessment and Plan / UC Course  I have reviewed the triage vital signs and the nursing notes.  Pertinent labs & imaging results that were available during my care of the patient were reviewed by me and considered in my medical decision making (see chart for details).     MDM: 1.  Rash and nonspecific skin eruption-IM Solu-Medrol 125 mg given once in clinic and prior to discharge, Rx'd Sterapred Unipak (tapering from 60 mg to 10 mg over 10 days). Advised patient to take medication as directed with food to completion.  Encouraged to increase daily water intake to 64 ounces per day while taking this medication.  Advised if symptoms worsen and/or unresolved please follow-up with PCP or here for further evaluation.  Final Clinical Impressions(s) / UC Diagnoses   Final diagnoses:  Rash and nonspecific skin eruption     Discharge Instructions      Advised patient to take medication as directed with food to completion.  Encouraged to increase daily water intake to 64 ounces per day while taking this medication.  Advised if symptoms worsen and/or unresolved please follow-up with PCP or here for further evaluation.     ED Prescriptions     Medication Sig Dispense Auth. Provider   predniSONE (STERAPRED UNI-PAK 21 TAB) 10 MG (21) TBPK tablet Take by mouth daily. Take 6 tabs by mouth daily  for 2 days, then 5 tabs for 2 days, then 4 tabs for 2 days, then 3 tabs for 2 days, 2 tabs for 2 days, then 1 tab by mouth daily for 2 days 42 tablet Trevor Iha, FNP      PDMP not reviewed this encounter.   Trevor Iha, FNP 05/18/23 1831

## 2023-08-07 ENCOUNTER — Other Ambulatory Visit: Payer: Self-pay

## 2023-08-07 ENCOUNTER — Ambulatory Visit
Admission: EM | Admit: 2023-08-07 | Discharge: 2023-08-07 | Disposition: A | Discharge status: Home / Self Care | Attending: Family Medicine | Admitting: Family Medicine

## 2023-08-07 DIAGNOSIS — J019 Acute sinusitis, unspecified: Secondary | ICD-10-CM

## 2023-08-07 LAB — POC SARS CORONAVIRUS 2 AG -  ED: SARS Coronavirus 2 Ag: NEGATIVE

## 2023-08-07 MED ORDER — AMOXICILLIN-POT CLAVULANATE 875-125 MG PO TABS: 1.0000 | ORAL_TABLET | Freq: Two times a day (BID) | ORAL | 0 refills | Status: AC

## 2023-08-07 MED ORDER — PREDNISONE 20 MG PO TABS: 20.0000 mg | ORAL_TABLET | Freq: Two times a day (BID) | ORAL | 0 refills | Status: AC

## 2023-08-07 NOTE — ED Triage Notes (Signed)
 X3 days has had nasal drainage, body aches, cough, headache. No fever. Has had zyrtec.

## 2023-08-07 NOTE — ED Provider Notes (Signed)
 [facility]    CSN: [id number] Arrival date & time: 08/07/23  1316      History   Chief Complaint Chief Complaint  Patient presents with   Nasal Congestion   Cough    HPI Kevin Wang is a 53 y.o. male.    Cough With a history of seasonal allergies presents today with a 3-day history congestion, persistent runny nose, postnasal drainage and headache. Patient reports he gets frequent sinus infections and his current symptoms are similar to that of previous sinus infections.  Patient has taken Zyrtec without relief of symptoms. Past Medical History:  Diagnosis Date   Allergy    Anxiety     Patient Active Problem List   Diagnosis Date Noted   Situational anxiety 09/19/2020    History reviewed. No pertinent surgical history.     Home Medications    Prior to Admission medications   Medication Sig Start Date End Date Taking? Authorizing Provider  amoxicillin-clavulanate (AUGMENTIN) 875-125 MG tablet Take 1 tablet by mouth every 12 (twelve) hours for 7 days. 08/10/23 08/17/23 Yes Bing Neighbors, NP  predniSONE (DELTASONE) 20 MG tablet Take 1 tablet (20 mg total) by mouth 2 (two) times daily with a meal for 5 days. 08/07/23 08/12/23 Yes Bing Neighbors, NP  ALPRAZolam Prudy Feeler) 0.25 MG tablet Take 1 tablet (0.25 mg total) by mouth 2 (two) times daily as needed. for anxiety 09/19/20   [name], NP    Family History Family History  Problem Relation Age of Onset   Asthma Mother    Cancer Mother        colon   Heart failure Mother    Lung cancer Father        smoker    Social History Social History   Tobacco Use   Smoking status: Never   Smokeless tobacco: Never  Substance Use Topics   Alcohol use: Yes    Alcohol/week: 1.0 standard drink of alcohol    Types: 1 Cans of beer per week    Comment: weekly   Drug use: No     Allergies   Patient has no known allergies.   Review of Systems Review of Systems  Respiratory:  Positive for  cough.      Physical Exam Triage Vital Signs ED Triage Vitals  Encounter Vitals Group     BP 08/07/23 1321 (!) 143/90     Systolic BP Percentile --      Diastolic BP Percentile --      Pulse Rate 08/07/23 1321 98     Resp 08/07/23 1321 16     Temp 08/07/23 1321 98.8 F (37.1 C)     Temp Source 08/07/23 1321 Oral     SpO2 08/07/23 1321 96 %     Weight --      Height --      Head Circumference --      Peak Flow --      Pain Score 08/07/23 1324 3     Pain Loc --      Pain Education --      Exclude from Growth Chart --    No data found.  Updated Vital Signs BP (!) 143/90   Pulse 98   Temp 98.8 F (37.1 C) (Oral)   Resp 16   SpO2 96%   Visual Acuity Right Eye Distance:   Left Eye Distance:   Bilateral Distance:    Right Eye Near:   Left Eye Near:  Bilateral Near:     Physical Exam  General Appearance:    Alert, cooperative, no distress  HENT:   Normocephalic, ears normal, nares with congestion, rhinorrhea, oropharynx post-nasal drainage present  Eyes:    PERRL, conjunctiva/corneas clear, EOM's intact       Lungs:     Clear to auscultation bilaterally, respirations unlabored  Heart:    Regular rate and rhythm  Neurologic:   Awake, alert, oriented x 3. No apparent focal neurological           defect.      UC Treatments / Results  Labs (all labs ordered are listed, but only abnormal results are displayed) Labs Reviewed  POC SARS CORONAVIRUS 2 AG -  ED    EKG   Radiology No results found.  Procedures Procedures (including critical care time)  Medications Ordered in UC Medications - No data to display  Initial Impression / Assessment and Plan / UC Course  I have reviewed the triage vital signs and the nursing notes.  Pertinent labs & imaging results that were available during my care of the patient were reviewed by me and considered in my medical decision making (see chart for details).    Acute Rhinosinusitis, discussed with patient the  symptoms are likely viral and likely improve with symptom management prescribed prednisone 20 mg twice daily and advised to continue with Zyrtec.  Advised patient I will prescribe an antibiotic in the event his symptoms worsen within 3 days he can stop by the pharmacy and pick up the antibiotics.  Antibiotics has a fill date for 3 days from today and cannot be filled prior to them.  Patient verbalized understanding and agreement with plan. Final Clinical Impressions(s) / UC Diagnoses   Final diagnoses:  Acute rhinosinusitis     Discharge Instructions      As discussed your symptoms are likely viral recommend starting the prednisone 20 mg twice daily and if your symptoms have worsen or not significantly improved after 3 days start the antibiotic which is Augmentin twice a day for 7 days.  As discussed continue cetirizine will help improve your nasal symptoms.  Follow-up with PCP or return for reevaluation if symptoms do not resolve with recommended treatment.     ED Prescriptions     Medication Sig Dispense Auth. Provider   predniSONE (DELTASONE) 20 MG tablet Take 1 tablet (20 mg total) by mouth 2 (two) times daily with a meal for 5 days. 10 tablet Bing Neighbors, NP   amoxicillin-clavulanate (AUGMENTIN) 875-125 MG tablet Take 1 tablet by mouth every 12 (twelve) hours for 7 days. 14 tablet Bing Neighbors, NP      PDMP not reviewed this encounter.   Bing Neighbors, NP 08/11/23 1924

## 2023-08-07 NOTE — Discharge Instructions (Signed)
 As discussed your symptoms are likely viral recommend starting the prednisone 20 mg twice daily and if your symptoms have worsen or not significantly improved after 3 days start the antibiotic which is Augmentin twice a day for 7 days.  As discussed continue cetirizine will help improve your nasal symptoms.  Follow-up with PCP or return for reevaluation if symptoms do not resolve with recommended treatment.

## 2023-08-11 ENCOUNTER — Encounter: Payer: Self-pay | Admitting: Family Medicine

## 2023-12-06 ENCOUNTER — Ambulatory Visit
Admission: EM | Admit: 2023-12-06 | Discharge: 2023-12-06 | Disposition: A | Discharge status: Home / Self Care | Attending: Family Medicine | Admitting: Family Medicine

## 2023-12-06 ENCOUNTER — Other Ambulatory Visit: Payer: Self-pay

## 2023-12-06 DIAGNOSIS — J0100 Acute maxillary sinusitis, unspecified: Secondary | ICD-10-CM

## 2023-12-06 DIAGNOSIS — R059 Cough, unspecified: Secondary | ICD-10-CM

## 2023-12-06 LAB — POC SARS CORONAVIRUS 2 AG -  ED: SARS Coronavirus 2 Ag: NEGATIVE

## 2023-12-06 MED ORDER — PREDNISONE 20 MG PO TABS: ORAL_TABLET | ORAL | 0 refills | Status: AC

## 2023-12-06 MED ORDER — AZITHROMYCIN 250 MG PO TABS: 250.0000 mg | ORAL_TABLET | Freq: Every day | ORAL | 0 refills | Status: AC

## 2023-12-06 NOTE — ED Triage Notes (Signed)
 Pt presents to uc with co cough and sore throat since last night. Pt reports he has not taken any otc medications.

## 2023-12-06 NOTE — ED Provider Notes (Signed)
 [facility]    CSN: [id number] Arrival date & time: 12/06/23  0820      History   Chief Complaint Chief Complaint  Patient presents with   Cough   Sore Throat    HPI Kevin Wang is a 53 y.o. male.   HPI 53 year old male presents with cough and sore throat for 3 days.  Patient believes that he may have picked up something from his wife over the past 2 days.  PMH significant for obesity and situational anxiety  Past Medical History:  Diagnosis Date   Allergy    Anxiety     Patient Active Problem List   Diagnosis Date Noted   Situational anxiety 09/19/2020    No past surgical history on file.     Home Medications    Prior to Admission medications   Medication Sig Start Date End Date Taking? Authorizing Provider  azithromycin  (ZITHROMAX ) 250 MG tablet Take 1 tablet (250 mg total) by mouth daily. Take first 2 tablets together, then 1 every day until finished. 12/06/23  Yes Teddy Sharper, FNP  predniSONE  (DELTASONE ) 20 MG tablet Take 3 tabs PO daily x 5 days. 12/06/23  Yes Teddy Sharper, FNP  ALPRAZolam  (XANAX ) 0.25 MG tablet Take 1 tablet (0.25 mg total) by mouth 2 (two) times daily as needed. for anxiety 09/19/20   Kip Ade, NP    Family History Family History  Problem Relation Age of Onset   Asthma Mother    Cancer Mother        colon   Heart failure Mother    Lung cancer Father        smoker    Social History Social History   Tobacco Use   Smoking status: Never   Smokeless tobacco: Never  Substance Use Topics   Alcohol use: Yes    Alcohol/week: 2.0 standard drinks of alcohol    Types: 2 Cans of beer per week    Comment: weekly   Drug use: No     Allergies   Patient has no known allergies.   Review of Systems Review of Systems  HENT:  Positive for postnasal drip and sore throat.   Respiratory:  Positive for cough.   All other systems reviewed and are negative.    Physical Exam Triage Vital Signs ED Triage Vitals   Encounter Vitals Group     BP      Girls Systolic BP Percentile      Girls Diastolic BP Percentile      Boys Systolic BP Percentile      Boys Diastolic BP Percentile      Pulse      Resp      Temp      Temp src      SpO2      Weight      Height      Head Circumference      Peak Flow      Pain Score      Pain Loc      Pain Education      Exclude from Growth Chart    No data found.  Updated Vital Signs BP (!) 155/93   Pulse 93   Temp 98.3 F (36.8 C)   Resp 19   SpO2 98%   Visual Acuity Right Eye Distance:   Left Eye Distance:   Bilateral Distance:    Right Eye Near:   Left Eye Near:    Bilateral Near:  Physical Exam Vitals and nursing note reviewed.  Constitutional:      General: He is not in acute distress.    Appearance: Normal appearance. He is obese. He is ill-appearing.  HENT:     Head: Normocephalic and atraumatic.     Right Ear: Tympanic membrane, ear canal and external ear normal.     Left Ear: Tympanic membrane, ear canal and external ear normal.     Mouth/Throat:     Mouth: Mucous membranes are moist.     Pharynx: Oropharynx is clear. Posterior oropharyngeal erythema present.  Eyes:     Extraocular Movements: Extraocular movements intact.     Conjunctiva/sclera: Conjunctivae normal.     Pupils: Pupils are equal, round, and reactive to light.  Cardiovascular:     Rate and Rhythm: Normal rate and regular rhythm.     Pulses: Normal pulses.     Heart sounds: Normal heart sounds.  Pulmonary:     Effort: Pulmonary effort is normal.     Breath sounds: Normal breath sounds. No wheezing, rhonchi or rales.     Comments: Infrequent nonproductive cough on exam Musculoskeletal:        General: Normal range of motion.     Cervical back: Normal range of motion and neck supple.  Skin:    General: Skin is warm and dry.  Neurological:     General: No focal deficit present.     Mental Status: He is alert and oriented to person, place, and time.   Psychiatric:        Mood and Affect: Mood normal.        Behavior: Behavior normal.      UC Treatments / Results  Labs (all labs ordered are listed, but only abnormal results are displayed) Labs Reviewed  POC SARS CORONAVIRUS 2 AG -  ED    EKG   Radiology No results found.  Procedures Procedures (including critical care time)  Medications Ordered in UC Medications - No data to display  Initial Impression / Assessment and Plan / UC Course  I have reviewed the triage vital signs and the nursing notes.  Pertinent labs & imaging results that were available during my care of the patient were reviewed by me and considered in my medical decision making (see chart for details).     MDM: 1.  Subacute maxillary sinusitis-Rx'd Zithromax  (500 mg day 1, then 250 mg day 2-5); 2.  Cough, unspecified type-Rx'd prednisone  20 mg tablet: Take 3 tablets p.o. daily x 5 days. Advised patient to take medications as directed with food to completion.  Advised patient to take prednisone  with Zithromax  to completion.  Encouraged to increase daily water intake to 64 ounces per day while taking these medication.  Advised if symptoms worsen and/or unresolved please follow-up with your PCP or here for further evaluation.  Patient discharged home, hemodynamically stable. Final Clinical Impressions(s) / UC Diagnoses   Final diagnoses:  Cough, unspecified type  Subacute maxillary sinusitis     Discharge Instructions      Advised patient to take medications as directed with food to completion.  Advised patient to take prednisone  with Zithromax  to completion.  Encouraged to increase daily water intake to 64 ounces per day while taking these medication.  Advised if symptoms worsen and/or unresolved please follow-up with your PCP or here for further evaluation.     ED Prescriptions     Medication Sig Dispense Auth. Provider   azithromycin  (ZITHROMAX ) 250 MG tablet Take 1 tablet (  250 mg total) by  mouth daily. Take first 2 tablets together, then 1 every day until finished. 6 tablet Lilyanna Lunt, FNP   predniSONE  (DELTASONE ) 20 MG tablet Take 3 tabs PO daily x 5 days. 15 tablet Marlia Schewe, FNP      PDMP not reviewed this encounter.   Teddy Sharper, FNP 12/06/23 [phone]

## 2023-12-06 NOTE — Discharge Instructions (Addendum)
 Advised patient to take medications as directed with food to completion.  Advised patient to take prednisone  with Zithromax  to completion.  Encouraged to increase daily water intake to 64 ounces per day while taking these medication.  Advised if symptoms worsen and/or unresolved please follow-up with your PCP or here for further evaluation.
